# Patient Record
Sex: Female | Born: 1950 | Race: White | Hispanic: No | Smoking: Current every day smoker
Health system: Southern US, Community
[De-identification: ages and names within clinical notes are randomized; demographics above are authoritative.]

## PROBLEM LIST (undated history)

## (undated) DIAGNOSIS — T7840XA Allergy, unspecified, initial encounter: Secondary | ICD-10-CM

## (undated) DIAGNOSIS — F0280 Dementia in other diseases classified elsewhere without behavioral disturbance: Secondary | ICD-10-CM

## (undated) DIAGNOSIS — M1990 Unspecified osteoarthritis, unspecified site: Secondary | ICD-10-CM

## (undated) DIAGNOSIS — G9600 Cerebrospinal fluid leak, unspecified: Secondary | ICD-10-CM

## (undated) DIAGNOSIS — F319 Bipolar disorder, unspecified: Secondary | ICD-10-CM

## (undated) DIAGNOSIS — F419 Anxiety disorder, unspecified: Secondary | ICD-10-CM

## (undated) DIAGNOSIS — I10 Essential (primary) hypertension: Secondary | ICD-10-CM

## (undated) DIAGNOSIS — G709 Myoneural disorder, unspecified: Secondary | ICD-10-CM

## (undated) DIAGNOSIS — M810 Age-related osteoporosis without current pathological fracture: Secondary | ICD-10-CM

## (undated) DIAGNOSIS — S069X9A Unspecified intracranial injury with loss of consciousness of unspecified duration, initial encounter: Secondary | ICD-10-CM

## (undated) DIAGNOSIS — G459 Transient cerebral ischemic attack, unspecified: Secondary | ICD-10-CM

## (undated) DIAGNOSIS — E531 Pyridoxine deficiency: Secondary | ICD-10-CM

## (undated) DIAGNOSIS — E079 Disorder of thyroid, unspecified: Secondary | ICD-10-CM

## (undated) DIAGNOSIS — E785 Hyperlipidemia, unspecified: Secondary | ICD-10-CM

## (undated) DIAGNOSIS — F32A Depression, unspecified: Secondary | ICD-10-CM

## (undated) DIAGNOSIS — H269 Unspecified cataract: Secondary | ICD-10-CM

## (undated) DIAGNOSIS — C801 Malignant (primary) neoplasm, unspecified: Secondary | ICD-10-CM

## (undated) DIAGNOSIS — S069XAA Unspecified intracranial injury with loss of consciousness status unknown, initial encounter: Secondary | ICD-10-CM

## (undated) DIAGNOSIS — G4730 Sleep apnea, unspecified: Secondary | ICD-10-CM

## (undated) DIAGNOSIS — E559 Vitamin D deficiency, unspecified: Secondary | ICD-10-CM

## (undated) HISTORY — PX: ROTATOR CUFF REPAIR: SHX139

## (undated) HISTORY — PX: RECTOVAGINAL FISTULA CLOSURE: SUR265

## (undated) HISTORY — DX: Depression, unspecified: F32.A

## (undated) HISTORY — DX: Sleep apnea, unspecified: G47.30

## (undated) HISTORY — PX: COLONOSCOPY: SHX174

## (undated) HISTORY — DX: Hyperlipidemia, unspecified: E78.5

## (undated) HISTORY — DX: Myoneural disorder, unspecified: G70.9

## (undated) HISTORY — DX: Essential (primary) hypertension: I10

## (undated) HISTORY — DX: Age-related osteoporosis without current pathological fracture: M81.0

## (undated) HISTORY — PX: ABDOMINAL HYSTERECTOMY: SHX81

## (undated) HISTORY — DX: Malignant (primary) neoplasm, unspecified: C80.1

## (undated) HISTORY — DX: Disorder of thyroid, unspecified: E07.9

## (undated) HISTORY — DX: Unspecified cataract: H26.9

## (undated) HISTORY — DX: Allergy, unspecified, initial encounter: T78.40XA

## (undated) HISTORY — DX: Anxiety disorder, unspecified: F41.9

## (undated) HISTORY — DX: Unspecified osteoarthritis, unspecified site: M19.90

## (undated) HISTORY — PX: OTHER SURGICAL HISTORY: SHX169

## (undated) HISTORY — DX: Dementia in other diseases classified elsewhere, unspecified severity, without behavioral disturbance, psychotic disturbance, mood disturbance, and anxiety: F02.80

---

## 2002-12-10 HISTORY — PX: THYROIDECTOMY: SHX17

## 2020-12-14 ENCOUNTER — Ambulatory Visit: Admit: 2020-12-14 | Discharge status: Absent without leave | Payer: Self-pay

## 2020-12-14 ENCOUNTER — Other Ambulatory Visit: Payer: Self-pay

## 2020-12-14 ENCOUNTER — Ambulatory Visit
Admission: EM | Admit: 2020-12-14 | Discharge: 2020-12-14 | Disposition: A | Discharge status: Home / Self Care | Payer: Medicare Other | Attending: Family Medicine | Admitting: Family Medicine

## 2020-12-14 ENCOUNTER — Ambulatory Visit (INDEPENDENT_AMBULATORY_CARE_PROVIDER_SITE_OTHER): Payer: Medicare Other

## 2020-12-14 ENCOUNTER — Encounter: Payer: Self-pay | Admitting: Family Medicine

## 2020-12-14 DIAGNOSIS — R059 Cough, unspecified: Secondary | ICD-10-CM

## 2020-12-14 DIAGNOSIS — J22 Unspecified acute lower respiratory infection: Secondary | ICD-10-CM

## 2020-12-14 DIAGNOSIS — Z1152 Encounter for screening for COVID-19: Secondary | ICD-10-CM

## 2020-12-14 MED ORDER — ALBUTEROL SULFATE HFA 108 (90 BASE) MCG/ACT IN AERS: 1.0000 | INHALATION_SPRAY | Freq: Four times a day (QID) | RESPIRATORY_TRACT | 0 refills | Status: DC | PRN

## 2020-12-14 MED ORDER — PREDNISONE 10 MG PO TABS: 40.0000 mg | ORAL_TABLET | Freq: Every day | ORAL | 0 refills | Status: AC

## 2020-12-14 MED ORDER — AZITHROMYCIN 250 MG PO TABS: 250.0000 mg | ORAL_TABLET | Freq: Every day | ORAL | 0 refills | Status: DC

## 2020-12-14 MED ORDER — BENZONATATE 100 MG PO CAPS: 200.0000 mg | ORAL_CAPSULE | Freq: Three times a day (TID) | ORAL | 0 refills | Status: DC

## 2020-12-14 MED ORDER — AMOXICILLIN-POT CLAVULANATE 875-125 MG PO TABS: 1.0000 | ORAL_TABLET | Freq: Two times a day (BID) | ORAL | 0 refills | Status: DC

## 2020-12-14 MED ORDER — PREDNISONE 10 MG PO TABS: 40.0000 mg | ORAL_TABLET | Freq: Every day | ORAL | 0 refills | Status: DC

## 2020-12-14 NOTE — Discharge Instructions (Addendum)
Treating you for a respiratory infection and inflammation in the lungs. Antibiotics as prescribed.  Prednisone daily for 5 days for lung inflammation.  Albuterol as needed for cough, wheezing or shortness of breath.  Tessalon Perles for cough as needed. Please follow-up in 6 weeks for repeat x-ray of the lungs

## 2020-12-15 NOTE — ED Provider Notes (Signed)
Shannon Hart    CSN: [id number] Arrival date & time: 12/14/20  1352      History   Chief Complaint Chief Complaint  Patient presents with  . Cough  . Sore Throat    HPI Shannon Hart is a 70 y.o. female.   Pt is a 70 year old female that presents with cough, congestion, sore throat, low-grade fevers.  Symptoms been constant worsening over the past week.  Coughing up mucus.  Mild shortness of breath.  History of COPD     History reviewed. No pertinent past medical history.  There are no problems to display for this patient.   History reviewed. No pertinent surgical history.  OB History   No obstetric history on file.      Home Medications    Prior to Admission medications   Medication Sig Start Date End Date Taking? Authorizing Provider  atorvastatin (LIPITOR) 40 MG tablet Take by mouth. 09/05/20  Yes [provider]  donepezil (ARICEPT) 10 MG tablet Take by mouth. 08/04/20 03/08/21 Yes [provider]  ergocalciferol (VITAMIN D2) 1.25 MG (50000 UT) capsule Take by mouth. 06/10/20 06/10/21 Yes [provider]  escitalopram (LEXAPRO) 20 MG tablet  10/10/19  Yes [provider]  ferrous sulfate 325 (65 FE) MG tablet Take by mouth. 06/10/20  Yes [provider]  fluticasone (FLONASE) 50 MCG/ACT nasal spray 2 sprays each nostril BID. DISP#  2 bottles = 1 month supply. 12/09/20  Yes [provider]  gabapentin (NEURONTIN) 300 MG capsule Take one capsule (300 mg) at 8am and one capsule (300 mg) at 2pm daily (600 mg total) and 2 capsule every night 06/10/20  Yes [provider]  HYDROcodone-acetaminophen (NORCO/VICODIN) 5-325 MG tablet Take by mouth. 11/24/19  Yes [provider]  levothyroxine (SYNTHROID) 88 MCG tablet Take by mouth. 12/09/20 12/09/21 Yes [provider]  lisinopril (ZESTRIL) 20 MG tablet Take by mouth. 06/10/20  Yes [provider]  venlafaxine XR (EFFEXOR-XR) 150 MG 24 hr  capsule Take by mouth. 12/08/20 03/08/21 Yes [provider]  albuterol (VENTOLIN HFA) 108 (90 Base) MCG/ACT inhaler Inhale 1-2 puffs into the lungs every 6 (six) hours as needed for wheezing or shortness of breath. 12/14/20   Loura Halt A, NP  amoxicillin-clavulanate (AUGMENTIN) 875-125 MG tablet Take 1 tablet by mouth every 12 (twelve) hours. 12/14/20   Loura Halt A, NP  aspirin 81 MG EC tablet Take by mouth.    [provider]  azithromycin (ZITHROMAX) 250 MG tablet Take 1 tablet (250 mg total) by mouth daily. Take first 2 tablets together, then 1 every day until finished. 12/14/20   Loura Halt A, NP  benzonatate (TESSALON) 100 MG capsule Take 2 capsules (200 mg total) by mouth every 8 (eight) hours. 12/14/20   Orvan July, NP  butalbital-aspirin-caffeine-codeine (FIORINAL WITH CODEINE) 50-325-40-30 MG capsule Take by mouth.    [provider]  donepezil (ARICEPT) 10 MG tablet Take 10 mg by mouth at bedtime. 11/29/20   [provider]  predniSONE (DELTASONE) 10 MG tablet Take 4 tablets (40 mg total) by mouth daily for 5 days. 12/14/20 12/19/20  Loura Halt A, NP  trospium (SANCTURA) 20 MG tablet Take 20 mg by mouth 2 (two) times daily. 08/27/20   [provider]    Family History History reviewed. No pertinent family history.  Social History     Allergies   Patient has no known allergies.   Review of Systems Review of  Systems   Physical Exam Triage Vital Signs ED Triage Vitals  Enc Vitals Group     BP 12/14/20 1444 130/77     Pulse Rate 12/14/20 1444 83     Resp 12/14/20 1444 (!) 22     Temp 12/14/20 1444 99.3 F (37.4 C)     Temp src --      SpO2 12/14/20 1444 (!) 89 %     Weight --      Height --      Head Circumference --      Peak Flow --      Pain Score 12/14/20 1445 0     Pain Loc --      Pain Edu? --      Excl. in [place]? --    No data found.  Updated Vital Signs BP 130/77   Pulse 83   Temp 99.3 F (37.4 C)   Resp (!) 22    SpO2 (!) 89%   Visual Acuity Right Eye Distance:   Left Eye Distance:   Bilateral Distance:    Right Eye Near:   Left Eye Near:    Bilateral Near:     Physical Exam Vitals and nursing note reviewed.  Constitutional:      General: She is not in acute distress.    Appearance: Normal appearance. She is not ill-appearing, toxic-appearing or diaphoretic.  HENT:     Head: Normocephalic.     Nose: Nose normal.  Eyes:     Conjunctiva/sclera: Conjunctivae normal.  Cardiovascular:     Rate and Rhythm: Normal rate and regular rhythm.  Pulmonary:     Effort: Pulmonary effort is normal.     Breath sounds: Wheezing and rhonchi present.  Musculoskeletal:        General: Normal range of motion.     Cervical back: Normal range of motion.  Skin:    General: Skin is warm and dry.     Findings: No rash.  Neurological:     Mental Status: She is alert.  Psychiatric:        Mood and Affect: Mood normal.      UC Treatments / Results  Labs (all labs ordered are listed, but only abnormal results are displayed) Labs Reviewed  NOVEL CORONAVIRUS, NAA    EKG   Radiology DG Chest 2 View  Result Date: 12/14/2020 CLINICAL DATA:  Shortness of breath, productive cough EXAM: CHEST - 2 VIEW COMPARISON:  None. FINDINGS: 14 mm nodular opacity projecting over the right lower chest likely reflecting a nipple shadow. No focal consolidation. No pleural effusion or pneumothorax. Heart and mediastinal contours are unremarkable. No acute osseous abnormality. IMPRESSION: 1. No active cardiopulmonary disease. 2. A 14 mm nodular opacity projecting over the right lower chest likely reflecting a nipple shadow. Recommend repeat x-ray with nipple markers. Electronically Signed   By: Elige Ko   On: 12/14/2020 15:56    Procedures Procedures (including critical care time)  Medications Ordered in UC Medications - No data to display  Initial Impression / Assessment and Plan / UC Course  I have reviewed  the triage vital signs and the nursing notes.  Pertinent labs & imaging results that were available during my care of the patient were reviewed by me and considered in my medical decision making (see chart for details).     Lower respiratory infection X-ray with 14 mm nodular opacity projecting over the right lower chest.  Concern for pneumonia versus underlying carcinoma.  Rhonchi and  wheezing on exam.  Patient mildly ill-appearing with low-grade fever today.  Oxygen saturations 89%. Hx of COPD.  Treating with dual therapy based on comorbidities, smoking history and age.  Azithromycin and amoxicillin to treat. Albuterol as needed for cough, wheezing or shortness of breath. Prednisone burst for lung inflammation Tessalon Perles for cough Recommended repeat x-ray in 6 weeks for recheck Follow up as needed for continued or worsening symptoms  Final Clinical Impressions(s) / UC Diagnoses   Final diagnoses:  Lower respiratory infection     Discharge Instructions     Treating you for a respiratory infection and inflammation in the lungs. Antibiotics as prescribed.  Prednisone daily for 5 days for lung inflammation.  Albuterol as needed for cough, wheezing or shortness of breath.  Tessalon Perles for cough as needed. Please follow-up in 6 weeks for repeat x-ray of the lungs    ED Prescriptions    Medication Sig Dispense Auth. Provider   amoxicillin-clavulanate (AUGMENTIN) 875-125 MG tablet  (Status: Discontinued) Take 1 tablet by mouth every 12 (twelve) hours. 14 tablet Saliyah Gillin A, NP   azithromycin (ZITHROMAX) 250 MG tablet  (Status: Discontinued) Take 1 tablet (250 mg total) by mouth daily. Take first 2 tablets together, then 1 every day until finished. 6 tablet Daesia Zylka A, NP   predniSONE (DELTASONE) 10 MG tablet  (Status: Discontinued) Take 4 tablets (40 mg total) by mouth daily for 5 days. 20 tablet Galen Russman A, NP   albuterol (VENTOLIN HFA) 108 (90 Base) MCG/ACT inhaler   (Status: Discontinued) Inhale 1-2 puffs into the lungs every 6 (six) hours as needed for wheezing or shortness of breath. 1 each Ruthann Angulo A, NP   benzonatate (TESSALON) 100 MG capsule  (Status: Discontinued) Take 2 capsules (200 mg total) by mouth every 8 (eight) hours. 45 capsule Cierrah Dace A, NP   albuterol (VENTOLIN HFA) 108 (90 Base) MCG/ACT inhaler Inhale 1-2 puffs into the lungs every 6 (six) hours as needed for wheezing or shortness of breath. 1 each Loura Halt A, NP   amoxicillin-clavulanate (AUGMENTIN) 875-125 MG tablet Take 1 tablet by mouth every 12 (twelve) hours. 14 tablet Karas Pickerill A, NP   azithromycin (ZITHROMAX) 250 MG tablet Take 1 tablet (250 mg total) by mouth daily. Take first 2 tablets together, then 1 every day until finished. 6 tablet Samuel Rittenhouse A, NP   benzonatate (TESSALON) 100 MG capsule Take 2 capsules (200 mg total) by mouth every 8 (eight) hours. 45 capsule Danaka Llera A, NP   predniSONE (DELTASONE) 10 MG tablet Take 4 tablets (40 mg total) by mouth daily for 5 days. 20 tablet Loura Halt A, NP     PDMP not reviewed this encounter.   Orvan July, NP 12/15/20 [phone]

## 2020-12-16 LAB — SARS-COV-2, NAA 2 DAY TAT

## 2020-12-16 LAB — NOVEL CORONAVIRUS, NAA: SARS-CoV-2, NAA: NOT DETECTED

## 2021-06-15 ENCOUNTER — Emergency Department
Admission: EM | Admit: 2021-06-15 | Discharge: 2021-06-15 | Disposition: A | Discharge status: Home / Self Care | Payer: Medicare Other | Attending: Student in an Organized Health Care Education/Training Program | Admitting: Student in an Organized Health Care Education/Training Program

## 2021-06-15 ENCOUNTER — Other Ambulatory Visit: Payer: Self-pay

## 2021-06-15 ENCOUNTER — Emergency Department: Payer: Medicare Other

## 2021-06-15 ENCOUNTER — Encounter: Payer: Self-pay | Admitting: Emergency Medicine

## 2021-06-15 DIAGNOSIS — D734 Cyst of spleen: Secondary | ICD-10-CM | POA: Diagnosis not present

## 2021-06-15 DIAGNOSIS — F17210 Nicotine dependence, cigarettes, uncomplicated: Secondary | ICD-10-CM | POA: Insufficient documentation

## 2021-06-15 DIAGNOSIS — Z91040 Latex allergy status: Secondary | ICD-10-CM | POA: Insufficient documentation

## 2021-06-15 DIAGNOSIS — Z7982 Long term (current) use of aspirin: Secondary | ICD-10-CM | POA: Insufficient documentation

## 2021-06-15 DIAGNOSIS — R159 Full incontinence of feces: Secondary | ICD-10-CM | POA: Diagnosis not present

## 2021-06-15 DIAGNOSIS — R197 Diarrhea, unspecified: Secondary | ICD-10-CM | POA: Diagnosis present

## 2021-06-15 HISTORY — DX: Transient cerebral ischemic attack, unspecified: G45.9

## 2021-06-15 HISTORY — DX: Vitamin D deficiency, unspecified: E55.9

## 2021-06-15 HISTORY — DX: Pyridoxine deficiency: E53.1

## 2021-06-15 HISTORY — DX: Dementia in other diseases classified elsewhere, unspecified severity, without behavioral disturbance, psychotic disturbance, mood disturbance, and anxiety: F02.80

## 2021-06-15 HISTORY — DX: Cerebrospinal fluid leak, unspecified: G96.00

## 2021-06-15 HISTORY — DX: Unspecified intracranial injury with loss of consciousness of unspecified duration, initial encounter: S06.9X9A

## 2021-06-15 HISTORY — DX: Bipolar disorder, unspecified: F31.9

## 2021-06-15 HISTORY — DX: Unspecified intracranial injury with loss of consciousness status unknown, initial encounter: S06.9XAA

## 2021-06-15 LAB — COMPREHENSIVE METABOLIC PANEL
ALT: 20 U/L (ref 0–44)
AST: 22 U/L (ref 15–41)
Albumin: 4.1 g/dL (ref 3.5–5.0)
Alkaline Phosphatase: 127 U/L — ABNORMAL HIGH (ref 38–126)
Anion gap: 8 (ref 5–15)
BUN: 11 mg/dL (ref 8–23)
CO2: 28 mmol/L (ref 22–32)
Calcium: 8.8 mg/dL — ABNORMAL LOW (ref 8.9–10.3)
Chloride: 105 mmol/L (ref 98–111)
Creatinine, Ser: 0.65 mg/dL (ref 0.44–1.00)
GFR, Estimated: >60 mL/min (ref >60)
Glucose, Bld: 141 mg/dL — ABNORMAL HIGH (ref 70–99)
Potassium: 3.3 mmol/L — ABNORMAL LOW (ref 3.5–5.1)
Sodium: 141 mmol/L (ref 135–145)
Total Bilirubin: 0.6 mg/dL (ref 0.3–1.2)
Total Protein: 6.8 g/dL (ref 6.5–8.1)

## 2021-06-15 LAB — CBC
HCT: 37.6 % (ref 36.0–46.0)
Hemoglobin: 12.1 g/dL (ref 12.0–15.0)
MCH: 30.6 pg (ref 26.0–34.0)
MCHC: 32.2 g/dL (ref 30.0–36.0)
MCV: 95.2 fL (ref 80.0–100.0)
Platelets: 190 10*3/uL (ref 150–400)
RBC: 3.95 MIL/uL (ref 3.87–5.11)
RDW: 13 % (ref 11.5–15.5)
WBC: 6.6 10*3/uL (ref 4.0–10.5)
nRBC: 0 % (ref 0.0–0.2)

## 2021-06-15 LAB — URINALYSIS, COMPLETE (UACMP) WITH MICROSCOPIC
Bilirubin Urine: NEGATIVE
Glucose, UA: NEGATIVE mg/dL
Hgb urine dipstick: NEGATIVE
Ketones, ur: NEGATIVE mg/dL
Nitrite: NEGATIVE
Protein, ur: NEGATIVE mg/dL
Specific Gravity, Urine: 1.011 (ref 1.005–1.030)
pH: 5 (ref 5.0–8.0)

## 2021-06-15 LAB — WET PREP, GENITAL
Clue Cells Wet Prep HPF POC: NONE SEEN
Sperm: NONE SEEN
Trich, Wet Prep: NONE SEEN
Yeast Wet Prep HPF POC: NONE SEEN

## 2021-06-15 LAB — LIPASE, BLOOD: Lipase: 54 U/L — ABNORMAL HIGH (ref 11–51)

## 2021-06-15 NOTE — ED Provider Notes (Signed)
[facility] Emergency Department Provider Note  ____________________________________________   Event Date/Time   First MD Initiated Contact with Patient 06/15/21 1318     (approximate)  I have reviewed the triage vital signs and the nursing notes.   HISTORY  Chief Complaint Diarrhea  HPI Shannon Hart is a 70 y.o. female with history below including Alzheimer's, bipolar, TBI, presents to the ED concern for possible rectal vesicular fistula.  Patient would describe some staining of stool in her undergarment overnight, but denies any frank diarrhea.  She has had reconstructive surgery of the vaginal walls in the past, concern for possible recurrence.  She denies any fever, chills, sweats, chest pain, shortness of breath. Past Medical History:  Diagnosis Date   Alzheimer disease ([place])    Bipolar 1 disorder ([place])    CSF leak    TBI (traumatic brain injury) ([place])    TIA (transient ischemic attack)    Vitamin B6 deficiency    Vitamin D deficiency     There are no problems to display for this patient.   Past Surgical History:  Procedure Laterality Date   ABDOMINAL HYSTERECTOMY     RECTOVAGINAL FISTULA CLOSURE     ROTATOR CUFF REPAIR      Prior to Admission medications   Medication Sig Start Date End Date Taking? Authorizing Provider  albuterol (VENTOLIN HFA) 108 (90 Base) MCG/ACT inhaler Inhale 1-2 puffs into the lungs every 6 (six) hours as needed for wheezing or shortness of breath. 12/14/20   Loura Halt A, NP  amoxicillin-clavulanate (AUGMENTIN) 875-125 MG tablet Take 1 tablet by mouth every 12 (twelve) hours. 12/14/20   Loura Halt A, NP  aspirin 81 MG EC tablet Take by mouth.    [provider]  atorvastatin (LIPITOR) 40 MG tablet Take by mouth. 09/05/20   [provider]  azithromycin (ZITHROMAX) 250 MG tablet Take 1 tablet (250 mg total) by mouth daily. Take first 2 tablets together, then 1 every day until finished. 12/14/20   Loura Halt A, NP  benzonatate (TESSALON) 100 MG capsule Take 2 capsules (200 mg total) by mouth every 8 (eight) hours. 12/14/20   Orvan July, NP  butalbital-aspirin-caffeine-codeine (FIORINAL WITH CODEINE) 50-325-40-30 MG capsule Take by mouth.    [provider]  donepezil (ARICEPT) 10 MG tablet Take by mouth. 08/04/20 03/08/21  [provider]  donepezil (ARICEPT) 10 MG tablet Take 10 mg by mouth at bedtime. 11/29/20   [provider]  escitalopram (LEXAPRO) 20 MG tablet  10/10/19   [provider]  ferrous sulfate 325 (65 FE) MG tablet Take by mouth. 06/10/20   [provider]  fluticasone (FLONASE) 50 MCG/ACT nasal spray 2 sprays each nostril BID. DISP#  2 bottles = 1 month supply. 12/09/20   [provider]  gabapentin (NEURONTIN) 300 MG capsule Take one capsule (300 mg) at 8am and one capsule (300 mg) at 2pm daily (600 mg total) and 2 capsule every night 06/10/20   [provider]  HYDROcodone-acetaminophen (NORCO/VICODIN) 5-325 MG tablet Take by mouth. 11/24/19   [provider]  levothyroxine (SYNTHROID) 88 MCG tablet Take by mouth. 12/09/20 12/09/21  [provider]  lisinopril (ZESTRIL) 20 MG tablet Take by mouth. 06/10/20   [provider]  trospium (SANCTURA) 20 MG tablet Take 20 mg by mouth 2 (two) times daily. 08/27/20   [provider]  venlafaxine XR (EFFEXOR-XR) 150 MG 24 hr capsule Take by mouth. 12/08/20 03/08/21  [provider]    Allergies Latex  History reviewed. No pertinent family history.  Social History Social History   Tobacco Use   Smoking status: Every Day    Pack years: 0.00    Types: Cigarettes   Smokeless tobacco: Never  Substance Use Topics   Alcohol use: Not Currently   Drug use: Not Currently    Review of Systems  Constitutional: No fever/chills Eyes: No visual changes. ENT: No sore throat. Cardiovascular: Denies chest pain. Respiratory: Denies  shortness of breath. Gastrointestinal: No abdominal pain.  No nausea, no vomiting.  No diarrhea.  No constipation.  Stool leakage as noted above. Genitourinary: Negative for dysuria. Musculoskeletal: Negative for back pain. Skin: Negative for rash. Neurological: Negative for headaches, focal weakness or numbness. ____________________________________________   PHYSICAL EXAM:  VITAL SIGNS: ED Triage Vitals  Enc Vitals Group     BP 06/15/21 1345 (!) 157/84     Pulse Rate 06/15/21 1345 64     Resp 06/15/21 1345 18     Temp 06/15/21 1649 98.7 F (37.1 C)     Temp Source 06/15/21 1649 Oral     SpO2 06/15/21 1345 99 %     Weight 06/15/21 1228 162 lb (73.5 kg)     Height 06/15/21 1228 5\' 3"  (1.6 m)     Head Circumference --      Peak Flow --      Pain Score 06/15/21 1228 0     Pain Loc --      Pain Edu? --      Excl. in [place]? --     Constitutional: Alert and oriented. Well appearing and in no acute distress. Eyes: Conjunctivae are normal. PERRL. EOMI. Head: Atraumatic. Nose: No congestion/rhinnorhea. Mouth/Throat: Mucous membranes are moist.  Oropharynx non-erythematous. Neck: No stridor.   Cardiovascular: Normal rate, regular rhythm. Grossly normal heart sounds.  Good peripheral circulation. Respiratory: Normal respiratory effort.  No retractions. Lungs CTAB. Gastrointestinal: Soft and nontender. No distention. No abdominal bruits. No CVA tenderness.  Normal rectal tone.  Brown stool in the vault.  Heme negative stool sample. Genitourinary: Normal external genitalia.  Cervix is absent.  No feculent material seen in the vault.  No sinus tracts or skin tags noted around the perineum. Musculoskeletal: No lower extremity tenderness nor edema.  No joint effusions. Neurologic:  Normal speech and language. No gross focal neurologic deficits are appreciated. No gait instability. Skin:  Skin is warm, dry and intact. No rash noted. Psychiatric: Mood and affect are normal. Speech and behavior  are normal.  ____________________________________________   LABS (all labs ordered are listed, but only abnormal results are displayed)  Labs Reviewed  WET PREP, GENITAL - Abnormal; Notable for the following components:      Result Value   WBC, Wet Prep HPF POC FEW (*)    All other components within normal limits  LIPASE, BLOOD - Abnormal; Notable for the following components:   Lipase 54 (*)    All other components within normal limits  COMPREHENSIVE METABOLIC PANEL - Abnormal; Notable for the following components:   Potassium 3.3 (*)    Glucose, Bld 141 (*)    Calcium 8.8 (*)    Alkaline Phosphatase 127 (*)    All other components within normal limits  URINALYSIS, COMPLETE (UACMP) WITH MICROSCOPIC - Abnormal; Notable for the following components:   Color, Urine YELLOW (*)    APPearance HAZY (*)    Leukocytes,Ua TRACE (*)    Bacteria, UA RARE (*)  All other components within normal limits  CBC   ____________________________________________  EKG   ____________________________________________  RADIOLOGY I, Melvenia Needles, personally viewed and evaluated these images (plain radiographs) as part of my medical decision making, as well as reviewing the written report by the radiologist.  ED MD interpretation:  agree with report  Official radiology report(s): CT ABDOMEN PELVIS WO CONTRAST  Result Date: 06/15/2021 CLINICAL DATA:  Diarrhea. 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 they are unsure if the stool is coming from her rectum or her vagina. PT has had reconstruction of the vaginal wall to the rectum in the past. EXAM: CT ABDOMEN AND PELVIS WITHOUT CONTRAST TECHNIQUE: Multidetector CT imaging of the abdomen and pelvis was performed following the standard protocol without IV contrast. COMPARISON:  None. FINDINGS: Lower chest: No acute abnormality. Hepatobiliary: No focal liver abnormality. No gallstones, gallbladder wall thickening, or pericholecystic fluid. No biliary  dilatation. Pancreas: No focal lesion. Normal pancreatic contour. No surrounding inflammatory changes. No main pancreatic ductal dilatation. Spleen: Normal in size. Nonspecific 2.3 cm fluid density lesion within the splenic parenchyma. Adrenals/Urinary Tract: No adrenal nodule bilaterally. Couple punctate calcifications associated with the right kidney may be vascular. No nephrolithiasis, no hydronephrosis, and no contour-deforming renal mass. No ureterolithiasis or hydroureter. The urinary bladder is unremarkable. Stomach/Bowel: Stomach is within normal limits. No evidence of bowel wall thickening or dilatation. Appendix appears normal. Vascular/Lymphatic: No abdominal aorta or iliac aneurysm. Moderate atherosclerotic plaque of the aorta and its branches. No abdominal, pelvic, or inguinal lymphadenopathy. Reproductive: Status post hysterectomy. No adnexal masses. Other: Normal fat plane identified between the vagina and rectum. Grossly normal fat plane identified surrounding the urinary bladder. No intraperitoneal free fluid. No intraperitoneal free gas. No organized fluid collection. Musculoskeletal: No abdominal wall hernia or abnormality.  Pelvic floor laxity. No suspicious lytic or blastic osseous lesions. No acute displaced fracture. Multilevel degenerative changes of the spine. Grade 1 anterolisthesis of L4 on L5. IMPRESSION: 1. No fistulization identified with grossly normal fat plane surrounding the rectum, urinary bladder, vagina. Please note limited evaluation on this noncontrast study. 2. Pelvic floor laxity. 3. Status post hysterectomy. 4. Nonspecific 2.3 cm splenic cystic lesion. Correlation with prior cross-sectional imaging would be of value. 5.  Aortic Atherosclerosis (ICD10-I70.0). Electronically Signed   By: Iven Finn M.D.   On: 06/15/2021 16:03    ____________________________________________   PROCEDURES  Procedure(s) performed (including Critical  Care):  Procedures   ____________________________________________   INITIAL IMPRESSION / ASSESSMENT AND PLAN / ED COURSE  As part of my medical decision making, I reviewed the following data within the [place] History obtained from family, Labs reviewed WNL, Radiograph reviewed as above, and Notes from prior ED visits     DDX: constipation, fecal impaction, encopresis, fistula   Female patient ED evaluation of concern for possible rectovesicular fistula after she has had some stool leakage over the last day or so.  Exam was reassuring at this time as it showed no fecal material in the vaginal vault.  CT imaging also did not reveal any obvious fistula development.  Patient likely is experiencing some mild ankle paresis without indication of bowel obstruction or fecal impaction.  She will follow-up with her GYN urology specialist for ongoing symptoms.  Return precautions are reviewed. ___________________________________________   FINAL CLINICAL IMPRESSION(S) / ED DIAGNOSES  Final diagnoses:  Encopresis     ED Discharge Orders     None        Note:  This document was prepared  using Systems analyst and may include unintentional dictation errors.    Melvenia Needles, PA-C 06/15/21 2022    Nena Polio, MD 06/17/21 2137

## 2021-06-15 NOTE — Discharge Instructions (Addendum)
Your exam, labs, and CT scan are all normal and reassuring at this time.  No signs of a fistula between the rectum and vagina.  You should follow-up with your primary provider or GYN/Urology specialist for ongoing symptoms.

## 2021-06-15 NOTE — ED Triage Notes (Signed)
Pt comes into the ED via POV c/o diarrhea at night.  Per the family, they are unsure if the stool is coming from her rectum or her vagina.  PT has had reconstruction of the vaginal wall to the rectum in the past.  PT states she is not having full BM's in the morning after they find some leakage in her brief.  PT in NAD at this time.

## 2021-12-10 HISTORY — PX: DG SHOULDER RIGHT COMPLETE: HXRAD1612

## 2021-12-12 IMAGING — DX DG CHEST 2V
2 series · 2 of 2 positions shown · non-contrast
Comparison: None.

CLINICAL DATA: Shortness of breath, productive cough

EXAM:
CHEST - 2 VIEW

[chest pa]
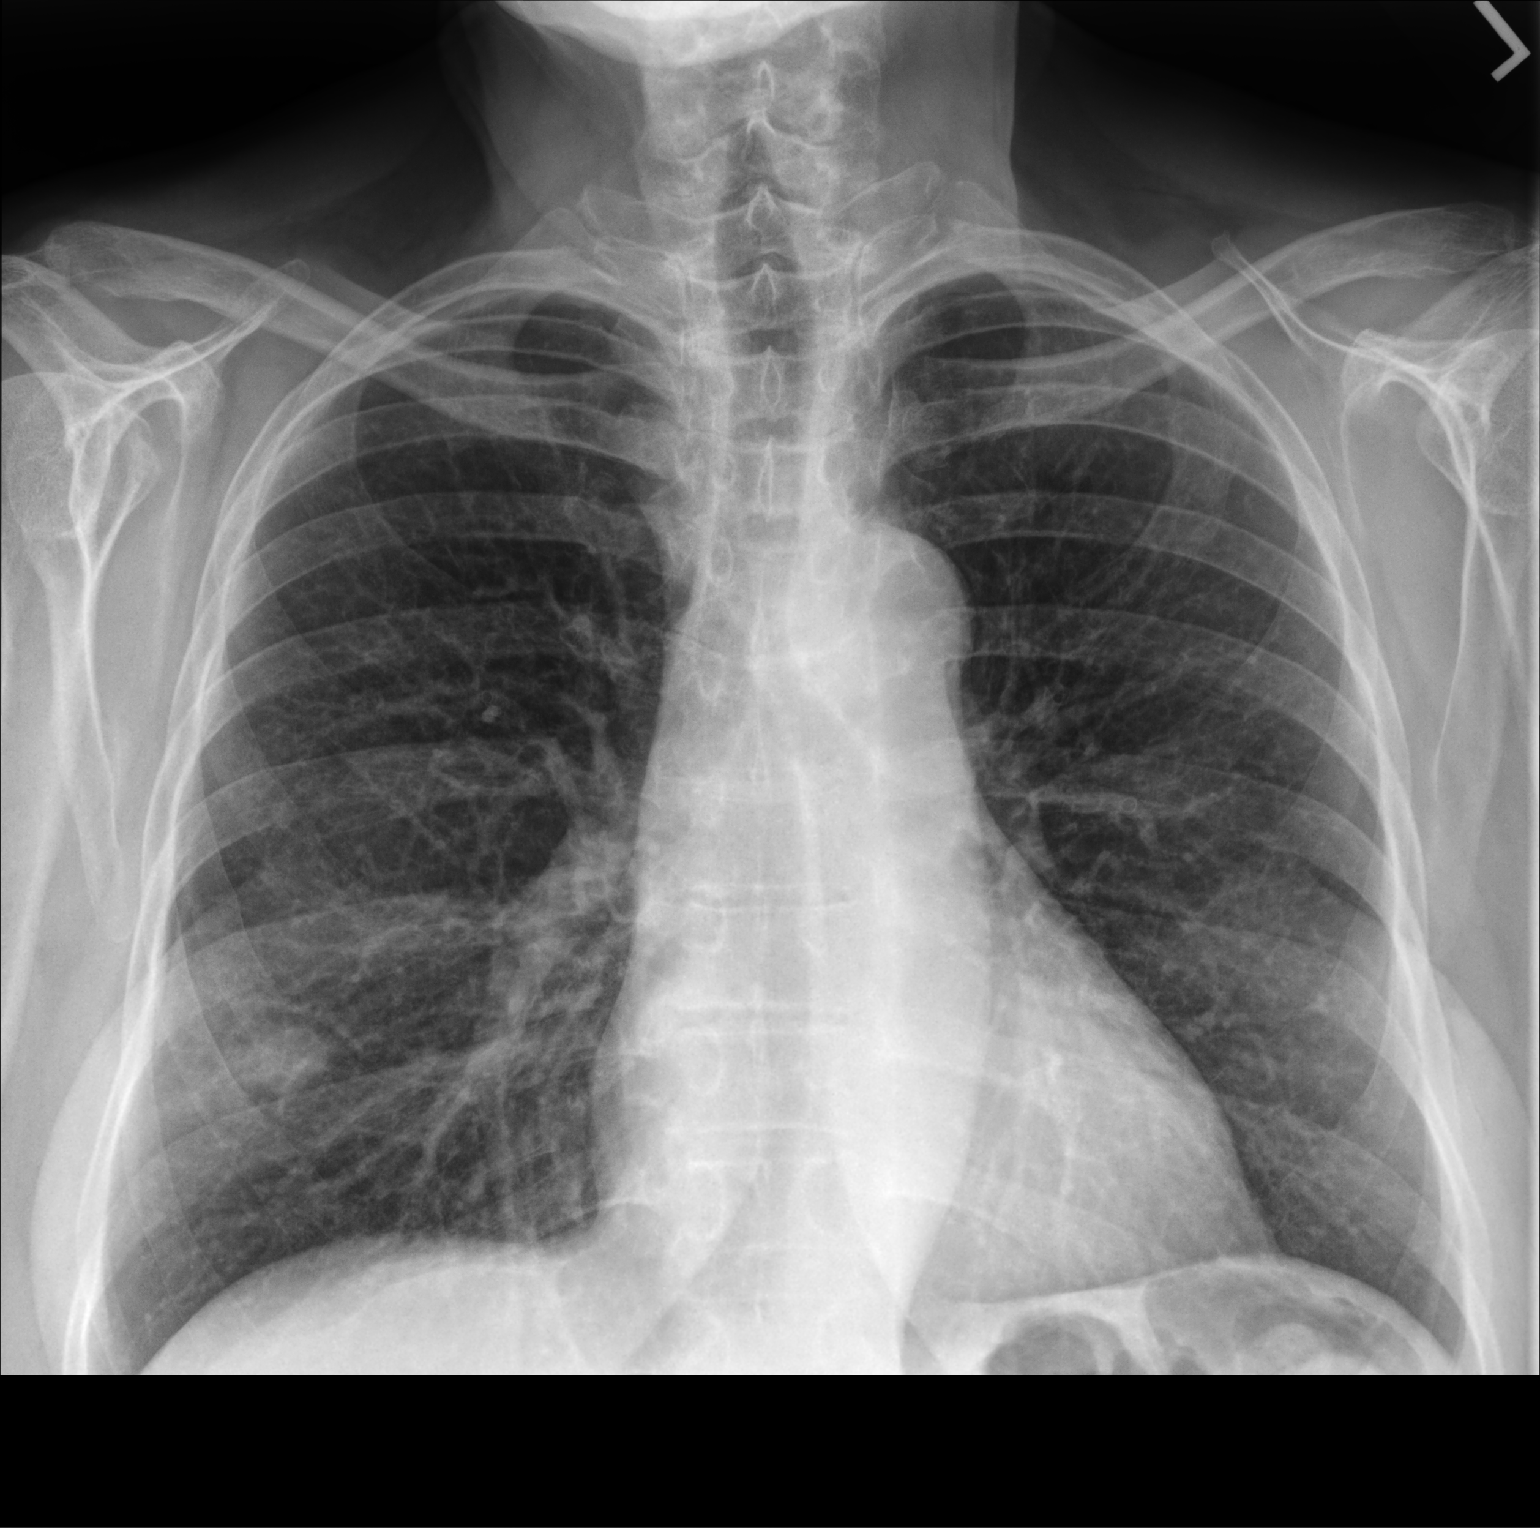

[chest lat]
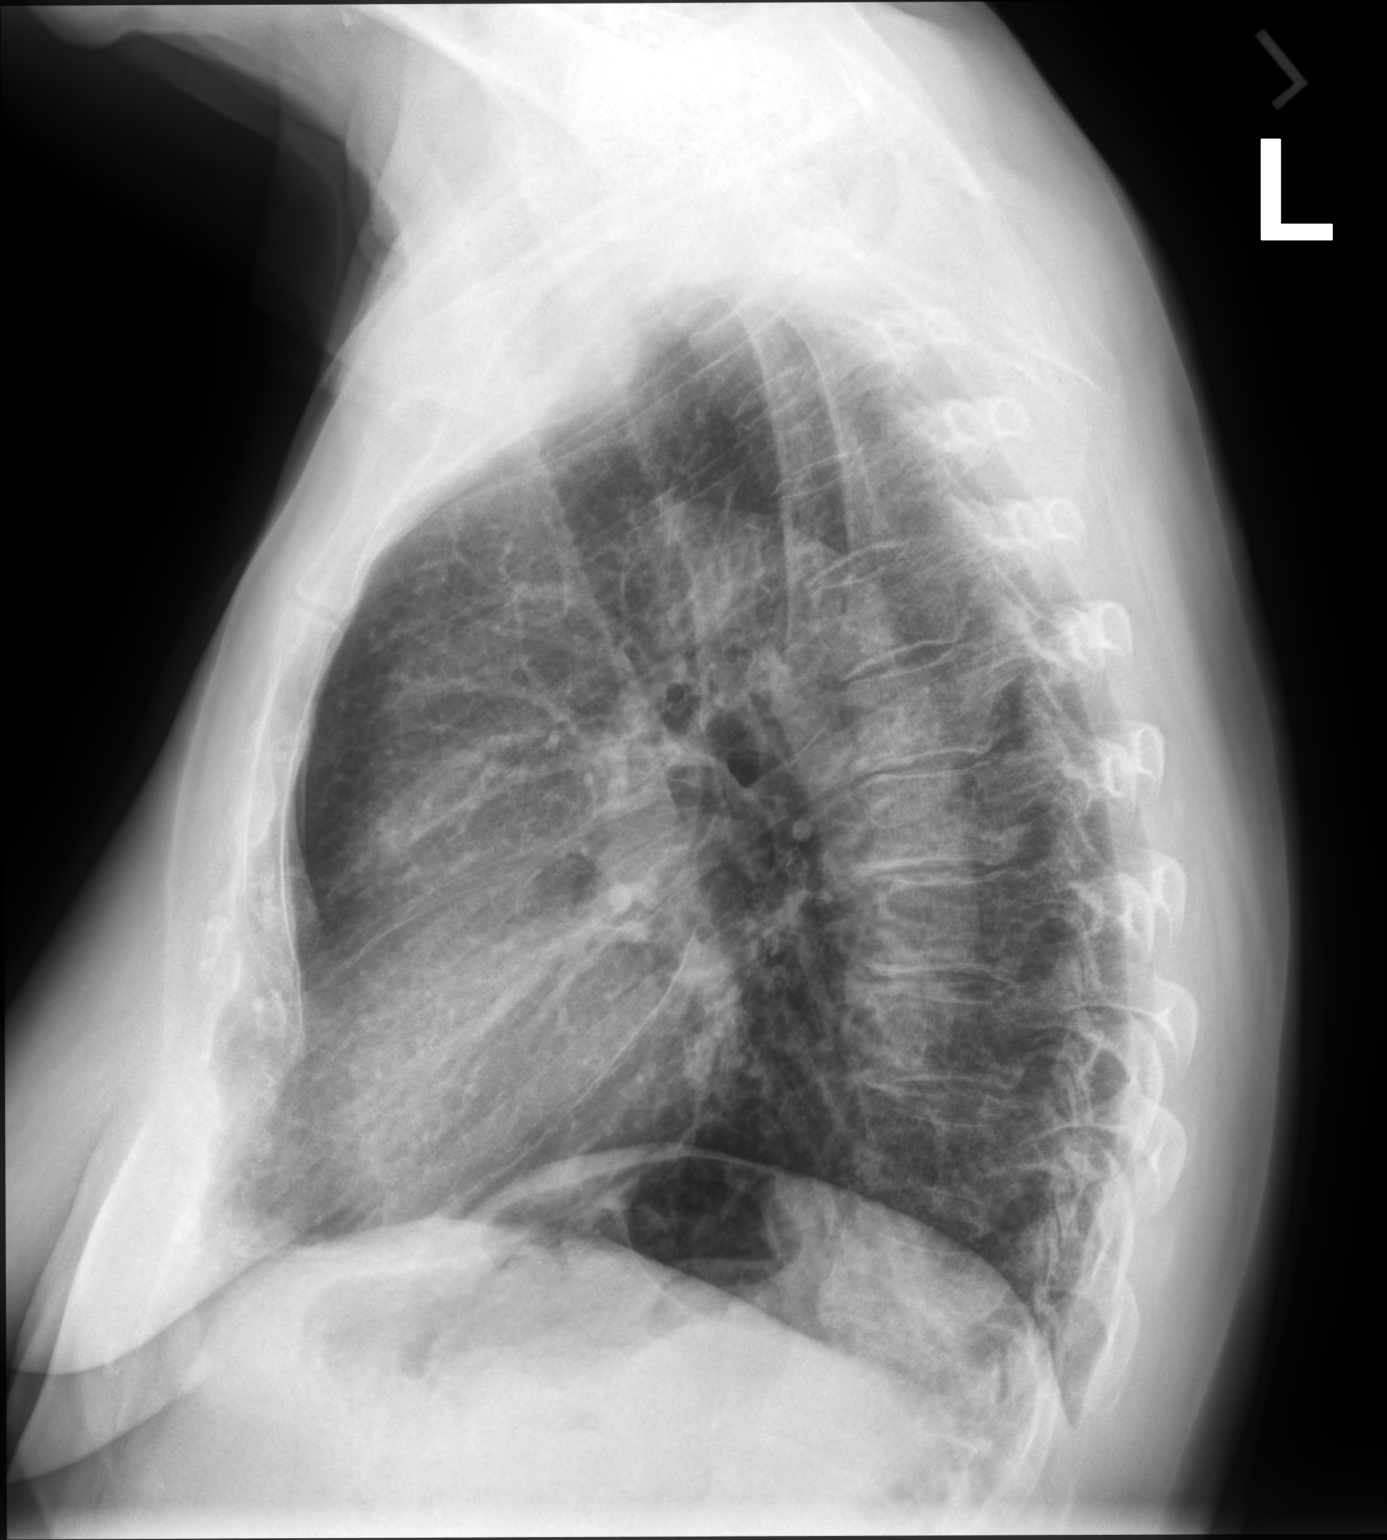

[2 of 2 positions shown; findings below may reference images not displayed]

FINDINGS: 14 mm nodular opacity projecting over the right lower chest likely
reflecting a nipple shadow. No focal consolidation. No pleural
effusion or pneumothorax. Heart and mediastinal contours are
unremarkable.

No acute osseous abnormality.
IMPRESSION: 1. No active cardiopulmonary disease.
2. A 14 mm nodular opacity projecting over the right lower chest
likely reflecting a nipple shadow. Recommend repeat x-ray with
nipple markers.

## 2022-11-09 ENCOUNTER — Encounter: Payer: Self-pay | Admitting: Adult Health

## 2022-11-09 ENCOUNTER — Ambulatory Visit (INDEPENDENT_AMBULATORY_CARE_PROVIDER_SITE_OTHER): Payer: Medicare Other | Admitting: Adult Health

## 2022-11-09 VITALS — BP 128/82 | HR 78 | Temp 97.3°F | Resp 18 | Ht 63.0 in | Wt 166.0 lb

## 2022-11-09 DIAGNOSIS — G629 Polyneuropathy, unspecified: Secondary | ICD-10-CM

## 2022-11-09 DIAGNOSIS — E890 Postprocedural hypothyroidism: Secondary | ICD-10-CM

## 2022-11-09 DIAGNOSIS — Z7689 Persons encountering health services in other specified circumstances: Secondary | ICD-10-CM

## 2022-11-09 DIAGNOSIS — Z1231 Encounter for screening mammogram for malignant neoplasm of breast: Secondary | ICD-10-CM

## 2022-11-09 DIAGNOSIS — R29818 Other symptoms and signs involving the nervous system: Secondary | ICD-10-CM | POA: Diagnosis not present

## 2022-11-09 DIAGNOSIS — Z13820 Encounter for screening for osteoporosis: Secondary | ICD-10-CM

## 2022-11-09 DIAGNOSIS — M797 Fibromyalgia: Secondary | ICD-10-CM

## 2022-11-09 DIAGNOSIS — Z85828 Personal history of other malignant neoplasm of skin: Secondary | ICD-10-CM

## 2022-11-09 DIAGNOSIS — F5101 Primary insomnia: Secondary | ICD-10-CM | POA: Diagnosis not present

## 2022-11-09 DIAGNOSIS — F319 Bipolar disorder, unspecified: Secondary | ICD-10-CM

## 2022-11-09 DIAGNOSIS — G44229 Chronic tension-type headache, not intractable: Secondary | ICD-10-CM

## 2022-11-09 DIAGNOSIS — N3281 Overactive bladder: Secondary | ICD-10-CM

## 2022-11-09 DIAGNOSIS — Z1211 Encounter for screening for malignant neoplasm of colon: Secondary | ICD-10-CM

## 2022-11-09 DIAGNOSIS — I10 Essential (primary) hypertension: Secondary | ICD-10-CM

## 2022-11-09 NOTE — Patient Instructions (Signed)
Preventive Care 65 Years and Older, Female Preventive care refers to lifestyle choices and visits with your health care provider that can promote health and wellness. Preventive care visits are also called wellness exams. What can I expect for my preventive care visit? Counseling Your health care provider may ask you questions about your: Medical history, including: Past medical problems. Family medical history. Pregnancy and menstrual history. History of falls. Current health, including: Memory and ability to understand (cognition). Emotional well-being. Home life and relationship well-being. Sexual activity and sexual health. Lifestyle, including: Alcohol, nicotine or tobacco, and drug use. Access to firearms. Diet, exercise, and sleep habits. Work and work environment. Sunscreen use. Safety issues such as seatbelt and bike helmet use. Physical exam Your health care provider will check your: Height and weight. These may be used to calculate your BMI (body mass index). BMI is a measurement that tells if you are at a healthy weight. Waist circumference. This measures the distance around your waistline. This measurement also tells if you are at a healthy weight and may help predict your risk of certain diseases, such as type 2 diabetes and high blood pressure. Heart rate and blood pressure. Body temperature. Skin for abnormal spots. What immunizations do I need?  Vaccines are usually given at various ages, according to a schedule. Your health care provider will recommend vaccines for you based on your age, medical history, and lifestyle or other factors, such as travel or where you work. What tests do I need? Screening Your health care provider may recommend screening tests for certain conditions. This may include: Lipid and cholesterol levels. Hepatitis C test. Hepatitis B test. HIV (human immunodeficiency virus) test. STI (sexually transmitted infection) testing, if you are at  risk. Lung cancer screening. Colorectal cancer screening. Diabetes screening. This is done by checking your blood sugar (glucose) after you have not eaten for a while (fasting). Mammogram. Talk with your health care provider about how often you should have regular mammograms. BRCA-related cancer screening. This may be done if you have a family history of breast, ovarian, tubal, or peritoneal cancers. Bone density scan. This is done to screen for osteoporosis. Talk with your health care provider about your test results, treatment options, and if necessary, the need for more tests. Follow these instructions at home: Eating and drinking  Eat a diet that includes fresh fruits and vegetables, whole grains, lean protein, and low-fat dairy products. Limit your intake of foods with high amounts of sugar, saturated fats, and salt. Take vitamin and mineral supplements as recommended by your health care provider. Do not drink alcohol if your health care provider tells you not to drink. If you drink alcohol: Limit how much you have to 0-1 drink a day. Know how much alcohol is in your drink. In the [place] one drink equals one 12 oz bottle of beer (355 mL), one 5 oz glass of wine (148 mL), or one 1 oz glass of hard liquor (44 mL). Lifestyle Brush your teeth every morning and night with fluoride toothpaste. Floss one time each day. Exercise for at least 30 minutes 5 or more days each week. Do not use any products that contain nicotine or tobacco. These products include cigarettes, chewing tobacco, and vaping devices, such as e-cigarettes. If you need help quitting, ask your health care provider. Do not use drugs. If you are sexually active, practice safe sex. Use a condom or other form of protection in order to prevent STIs. Take aspirin only as told by   your health care provider. Make sure that you understand how much to take and what form to take. Work with your health care provider to find out whether it  is safe and beneficial for you to take aspirin daily. Ask your health care provider if you need to take a cholesterol-lowering medicine (statin). Find healthy ways to manage stress, such as: Meditation, yoga, or listening to music. Journaling. Talking to a trusted person. Spending time with friends and family. Minimize exposure to UV radiation to reduce your risk of skin cancer. Safety Always wear your seat belt while driving or riding in a vehicle. Do not drive: If you have been drinking alcohol. Do not ride with someone who has been drinking. When you are tired or distracted. While texting. If you have been using any mind-altering substances or drugs. Wear a helmet and other protective equipment during sports activities. If you have firearms in your house, make sure you follow all gun safety procedures. What's next? Visit your health care provider once a year for an annual wellness visit. Ask your health care provider how often you should have your eyes and teeth checked. Stay up to date on all vaccines. This information is not intended to replace advice given to you by your health care provider. Make sure you discuss any questions you have with your health care provider. Document Revised: 05/24/2021 Document Reviewed: 05/24/2021 Elsevier Patient Education  2023 Elsevier Inc.  
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
## 2022-11-10 LAB — CBC WITH DIFFERENTIAL/PLATELET
Absolute Monocytes: 364 cells/uL (ref 200–950)
Basophils Absolute: 42 cells/uL (ref 0–200)
Basophils Relative: 0.6 %
Eosinophils Absolute: 119 cells/uL (ref 15–500)
Eosinophils Relative: 1.7 %
HCT: 33.9 % — ABNORMAL LOW (ref 35.0–45.0)
Hemoglobin: 11.7 g/dL (ref 11.7–15.5)
Lymphs Abs: 2002 cells/uL (ref 850–3900)
MCH: 32 pg (ref 27.0–33.0)
MCHC: 34.5 g/dL (ref 32.0–36.0)
MCV: 92.6 fL (ref 80.0–100.0)
MPV: 9.5 fL (ref 7.5–12.5)
Monocytes Relative: 5.2 %
Neutro Abs: 4473 cells/uL (ref 1500–7800)
Neutrophils Relative %: 63.9 %
Platelets: 211 10*3/uL (ref 140–400)
RBC: 3.66 10*6/uL — ABNORMAL LOW (ref 3.80–5.10)
RDW: 13 % (ref 11.0–15.0)
Total Lymphocyte: 28.6 %
WBC: 7 10*3/uL (ref 3.8–10.8)

## 2022-11-10 LAB — COMPLETE METABOLIC PANEL WITH GFR
AG Ratio: 2.2 (calc) (ref 1.0–2.5)
ALT: 8 U/L (ref 6–29)
AST: 11 U/L (ref 10–35)
Albumin: 4.7 g/dL (ref 3.6–5.1)
Alkaline phosphatase (APISO): 178 U/L — ABNORMAL HIGH (ref 37–153)
BUN: 14 mg/dL (ref 7–25)
CO2: 29 mmol/L (ref 20–32)
Calcium: 9.2 mg/dL (ref 8.6–10.4)
Chloride: 104 mmol/L (ref 98–110)
Creat: 0.92 mg/dL (ref 0.60–1.00)
Globulin: 2.1 g/dL (calc) (ref 1.9–3.7)
Glucose, Bld: 87 mg/dL (ref 65–139)
Potassium: 3.9 mmol/L (ref 3.5–5.3)
Sodium: 143 mmol/L (ref 135–146)
Total Bilirubin: 0.6 mg/dL (ref 0.2–1.2)
Total Protein: 6.8 g/dL (ref 6.1–8.1)
eGFR: 67 mL/min/{1.73_m2} (ref >=60)

## 2022-11-10 LAB — TSH: TSH: 0.14 mIU/L — ABNORMAL LOW (ref 0.40–4.50)

## 2022-11-10 LAB — T3, FREE: T3, Free: 3.5 pg/mL (ref 2.3–4.2)

## 2022-11-10 LAB — T4, FREE: Free T4: 1.2 ng/dL (ref 0.8–1.8)

## 2022-11-16 ENCOUNTER — Other Ambulatory Visit: Payer: Self-pay | Admitting: Adult Health

## 2022-11-16 DIAGNOSIS — E890 Postprocedural hypothyroidism: Secondary | ICD-10-CM

## 2022-11-16 MED ORDER — LEVOTHYROXINE SODIUM 75 MCG PO TABS: 75.0000 ug | ORAL_TABLET | Freq: Every day | ORAL | 3 refills | Status: DC

## 2022-11-16 NOTE — Progress Notes (Signed)
Tsh lo, 0.14 (normal 0.4 to 4.50), sent new prescription to pharmacy. Tsh needs to be repeated on next visit. Alk phos 178, slightly elevate (normal 37 - 153), can be repeated next visit -  CBC normal

## 2022-11-20 ENCOUNTER — Encounter: Payer: Self-pay | Admitting: Adult Health

## 2022-11-20 DIAGNOSIS — M797 Fibromyalgia: Secondary | ICD-10-CM

## 2022-11-20 MED ORDER — PREGABALIN 50 MG PO CAPS: 50.0000 mg | ORAL_CAPSULE | Freq: Two times a day (BID) | ORAL | 5 refills | Status: DC

## 2022-11-20 NOTE — Telephone Encounter (Signed)
Patient requested refill.  Pended Rx and sent to [facility] for approval.

## 2022-11-21 ENCOUNTER — Telehealth: Payer: Self-pay

## 2022-11-21 ENCOUNTER — Encounter: Payer: Self-pay | Admitting: Adult Health

## 2022-11-21 NOTE — Telephone Encounter (Signed)
I called back and gave the approval.

## 2022-11-21 NOTE — Telephone Encounter (Signed)
Noted! Thank you

## 2022-11-21 NOTE — Telephone Encounter (Signed)
Message routed to PCP Medina-Vargas, Senaida Lange, NP

## 2022-11-21 NOTE — Telephone Encounter (Signed)
Medina-Vargas, Monina C, NP  You59 minutes ago (1:05 PM)    I have referred her to psychiatry on 11/09/22. FYI.

## 2022-11-21 NOTE — Telephone Encounter (Signed)
Kaitlyn from pill packing called and states that patient is going on vacation 11/29/2022-12/14/2021. She wanted to know if it was ok to give patient early supply of medication Lyrica '50mg'$ . Message routed to PCP Medina-Vargas, [place], NP .

## 2022-12-10 ENCOUNTER — Encounter: Payer: Self-pay | Admitting: Adult Health

## 2022-12-11 ENCOUNTER — Other Ambulatory Visit: Payer: Self-pay

## 2022-12-11 MED ORDER — FERROUS SULFATE 325 (65 FE) MG PO TABS: 325.0000 mg | ORAL_TABLET | Freq: Every day | ORAL | 1 refills | Status: DC

## 2022-12-13 ENCOUNTER — Ambulatory Visit: Payer: Medicare Other | Admitting: Adult Health

## 2022-12-17 ENCOUNTER — Encounter: Payer: Self-pay | Admitting: Adult Health

## 2022-12-17 ENCOUNTER — Ambulatory Visit (INDEPENDENT_AMBULATORY_CARE_PROVIDER_SITE_OTHER): Payer: Medicare Other | Admitting: Adult Health

## 2022-12-17 VITALS — BP 132/88 | HR 74 | Temp 94.4°F | Ht 63.0 in | Wt 164.0 lb

## 2022-12-17 DIAGNOSIS — Z23 Encounter for immunization: Secondary | ICD-10-CM

## 2022-12-17 DIAGNOSIS — Z122 Encounter for screening for malignant neoplasm of respiratory organs: Secondary | ICD-10-CM

## 2022-12-17 DIAGNOSIS — N3281 Overactive bladder: Secondary | ICD-10-CM

## 2022-12-17 DIAGNOSIS — I10 Essential (primary) hypertension: Secondary | ICD-10-CM | POA: Diagnosis not present

## 2022-12-17 DIAGNOSIS — R29818 Other symptoms and signs involving the nervous system: Secondary | ICD-10-CM

## 2022-12-17 DIAGNOSIS — F319 Bipolar disorder, unspecified: Secondary | ICD-10-CM | POA: Diagnosis not present

## 2022-12-17 DIAGNOSIS — M797 Fibromyalgia: Secondary | ICD-10-CM

## 2022-12-17 DIAGNOSIS — E890 Postprocedural hypothyroidism: Secondary | ICD-10-CM

## 2022-12-17 DIAGNOSIS — G44229 Chronic tension-type headache, not intractable: Secondary | ICD-10-CM

## 2022-12-17 MED ORDER — LISINOPRIL 40 MG PO TABS: 40.0000 mg | ORAL_TABLET | Freq: Every day | ORAL | 3 refills | Status: DC

## 2022-12-17 NOTE — Patient Instructions (Signed)

## 2022-12-17 NOTE — Progress Notes (Unsigned)
[facility] clinic  Provider: Durenda Age DNP  Code Status:  Full Code  Goals of Care:     11/09/2022    9:45 AM  Advanced Directives  Does Patient Have a Medical Advance Directive? No  Would patient like information on creating a medical advance directive? No - Patient declined     Chief Complaint  Patient presents with   Medical Management of Chronic Issues    Patient presents today for 1 month follow-up   Quality Metric Gaps    AWV,colonoscopy, DEXA scan, mammogram, Hep C screening, COVID#5    HPI: Patient is a 72 y.o. female seen today for medical  Past Medical History:  Diagnosis Date   Alzheimer disease ([place])    Bipolar 1 disorder ([place])    CSF leak    TBI (traumatic brain injury) ([place])    TIA (transient ischemic attack)    Vitamin B6 deficiency    Vitamin D deficiency     Past Surgical History:  Procedure Laterality Date   ABDOMINAL HYSTERECTOMY     RECTOVAGINAL FISTULA CLOSURE     ROTATOR CUFF REPAIR      Allergies  Allergen Reactions   Latex Rash   Other Rash    Tape made her have blisters   Wound Dressing Adhesive Rash    Outpatient Encounter Medications as of 12/17/2022  Medication Sig   ascorbic acid (VITAMIN C) 500 MG tablet Take 500 mg by mouth 2 (two) times daily.   atorvastatin (LIPITOR) 40 MG tablet Take by mouth.   donepezil (ARICEPT) 10 MG tablet Take 10 mg by mouth at bedtime.   ferrous sulfate 325 (65 FE) MG tablet Take 1 tablet (325 mg total) by mouth daily.   ketoconazole (NIZORAL) 2 % cream Apply 1 Application topically daily.   lamoTRIgine (LAMICTAL) 200 MG tablet Take 200 mg by mouth daily.   levothyroxine (SYNTHROID) 75 MCG tablet Take 1 tablet (75 mcg total) by mouth daily.   lisinopril (ZESTRIL) 20 MG tablet Take by mouth.   loratadine (CLARITIN) 10 MG tablet Take 10 mg by mouth daily.   melatonin 3 MG TABS tablet Take 3 mg by mouth at bedtime.   mirabegron ER (MYRBETRIQ) 50 MG TB24 tablet Take 50 mg by mouth daily.    Omega-3 Fatty Acids (FISH OIL) 1000 MG CAPS Take 1 capsule by mouth daily.   pregabalin (LYRICA) 50 MG capsule Take 1 capsule (50 mg total) by mouth 2 (two) times daily.   traZODone (DESYREL) 50 MG tablet Take 50 mg by mouth at bedtime.   trospium (SANCTURA) 20 MG tablet Take 20 mg by mouth 2 (two) times daily.   venlafaxine XR (EFFEXOR-XR) 150 MG 24 hr capsule Take by mouth.   No facility-administered encounter medications on file as of 12/17/2022.    Review of Systems:  Review of Systems  Constitutional:  Negative for appetite change, chills, fatigue and fever.  HENT:  Negative for congestion, hearing loss, rhinorrhea and sore throat.   Eyes: Negative.   Respiratory:  Negative for cough, shortness of breath and wheezing.   Cardiovascular:  Negative for chest pain, palpitations and leg swelling.  Gastrointestinal:  Negative for abdominal pain, constipation, diarrhea, nausea and vomiting.  Genitourinary:  Negative for dysuria.  Musculoskeletal:  Negative for arthralgias, back pain and myalgias.  Skin:  Negative for color change, rash and wound.  Neurological:  Negative for dizziness, weakness and headaches.  Psychiatric/Behavioral:  Negative for behavioral problems. The patient is not nervous/anxious.  Health Maintenance  Topic Date Due   Medicare Annual Wellness (AWV)  Never done   Hepatitis C Screening  Never done   COLONOSCOPY (Pts 45-54yr Insurance coverage will need to be confirmed)  Never done   Lung Cancer Screening  Never done   MAMMOGRAM  Never done   DEXA SCAN  Never done   INFLUENZA VACCINE  07/10/2022   COVID-19 Vaccine (5 - 2023-24 season) 08/10/2022   DTaP/Tdap/Td (3 - Td or Tdap) 03/14/2032   Pneumonia Vaccine 72 Years old  Completed   Zoster Vaccines- Shingrix  Completed   HPV VACCINES  Aged Out    Physical Exam: Vitals:   12/17/22 1515  BP: 132/88  Pulse: 74  Temp: (!) 94.4 F (34.7 C)  SpO2: 95%  Weight: 164 lb (74.4 kg)  Height: '5\' 3"'$  (1.6 m)    Body mass index is 29.05 kg/m. Physical Exam Constitutional:      General: She is not in acute distress.    Appearance: Normal appearance.  HENT:     Head: Normocephalic and atraumatic.     Nose: Nose normal.     Mouth/Throat:     Mouth: Mucous membranes are moist.  Eyes:     Conjunctiva/sclera: Conjunctivae normal.  Cardiovascular:     Rate and Rhythm: Normal rate and regular rhythm.  Pulmonary:     Effort: Pulmonary effort is normal.     Breath sounds: Normal breath sounds.  Abdominal:     General: Bowel sounds are normal.     Palpations: Abdomen is soft.  Musculoskeletal:        General: Normal range of motion.     Cervical back: Normal range of motion.  Skin:    General: Skin is warm and dry.  Neurological:     Mental Status: She is alert and oriented to person, place, and time. Mental status is at baseline.  Psychiatric:        Mood and Affect: Mood normal.        Behavior: Behavior normal.     Labs reviewed: Basic Metabolic Panel: Recent Labs    11/09/22 1418  NA 143  K 3.9  CL 104  CO2 29  GLUCOSE 87  BUN 14  CREATININE 0.92  CALCIUM 9.2  TSH 0.14*   Liver Function Tests: Recent Labs    11/09/22 1418  AST 11  ALT 8  BILITOT 0.6  PROT 6.8   No results for input(s): "LIPASE", "AMYLASE" in the last 8760 hours. No results for input(s): "AMMONIA" in the last 8760 hours. CBC: Recent Labs    11/09/22 1418  WBC 7.0  NEUTROABS 4,473  HGB 11.7  HCT 33.9*  MCV 92.6  PLT 211   Lipid Panel: No results for input(s): "CHOL", "HDL", "LDLCALC", "TRIG", "CHOLHDL", "LDLDIRECT" in the last 8760 hours. No results found for: "HGBA1C"  Procedures since last visit: No results found.  Assessment/Plan     Labs/tests ordered:  CT chest  Next appt:  Visit date not found

## 2023-01-03 ENCOUNTER — Ambulatory Visit (HOSPITAL_COMMUNITY): Payer: Medicare Other | Admitting: Psychiatry

## 2023-01-11 ENCOUNTER — Ambulatory Visit
Admission: RE | Admit: 2023-01-11 | Discharge: 2023-01-11 | Disposition: A | Discharge status: Home / Self Care | Payer: Medicare Other | Source: Ambulatory Visit | Attending: Adult Health | Admitting: Adult Health

## 2023-01-11 DIAGNOSIS — Z122 Encounter for screening for malignant neoplasm of respiratory organs: Secondary | ICD-10-CM

## 2023-01-14 ENCOUNTER — Other Ambulatory Visit: Payer: Self-pay

## 2023-01-14 ENCOUNTER — Encounter: Payer: Self-pay | Admitting: Adult Health

## 2023-01-14 ENCOUNTER — Encounter: Payer: Self-pay | Admitting: Urology

## 2023-01-14 ENCOUNTER — Ambulatory Visit: Payer: Medicare Other | Admitting: Urology

## 2023-01-14 VITALS — BP 169/93 | HR 77 | Ht 63.0 in | Wt 162.0 lb

## 2023-01-14 DIAGNOSIS — N3281 Overactive bladder: Secondary | ICD-10-CM

## 2023-01-14 DIAGNOSIS — F319 Bipolar disorder, unspecified: Secondary | ICD-10-CM

## 2023-01-14 DIAGNOSIS — N3946 Mixed incontinence: Secondary | ICD-10-CM | POA: Diagnosis not present

## 2023-01-14 LAB — MICROSCOPIC EXAMINATION: WBC, UA: >30 /hpf — AB (ref 0–5)

## 2023-01-14 LAB — URINALYSIS, COMPLETE
Bilirubin, UA: NEGATIVE
Glucose, UA: NEGATIVE
Ketones, UA: NEGATIVE
Nitrite, UA: POSITIVE — AB
RBC, UA: NEGATIVE
Specific Gravity, UA: 1.02 (ref 1.005–1.030)
Urobilinogen, Ur: 1 mg/dL (ref 0.2–1.0)
pH, UA: 5 (ref 5.0–7.5)

## 2023-01-14 MED ORDER — LAMOTRIGINE 200 MG PO TABS: 200.0000 mg | ORAL_TABLET | Freq: Every day | ORAL | 1 refills | Status: DC

## 2023-01-14 MED ORDER — ATORVASTATIN CALCIUM 40 MG PO TABS: 40.0000 mg | ORAL_TABLET | Freq: Every day | ORAL | 1 refills | Status: DC

## 2023-01-14 NOTE — Progress Notes (Signed)
01/14/2023 1:12 PM   Shannon Hart [birth year] [id number]  Referring provider: Nickola Major, NP [address],  [place] [address]  No chief complaint on file.   HPI: I was consulted to assess the patient's urinary incontinence.  She primarily has urge incontinence.  She can leak sometimes with coughing sneezing.  She has moderate to severe bedwetting.  She wears 5-8 pads a day moderately wet  She voids every 2 hours gets up to 3 times a night.  Flow was good.  She has had a previous bladder suspension.  It sounds like she may be having a planned bladder repair in [facility] but she is moving to [place] where her daughter lives.  I reviewed the medical records and she is likely had at least 1 prolapse repair and perhaps an abdominal repair with mesh.  She is also been tried on Gemtesa in the past.  I looked at the urology notes from 2023 and they talked her about the 3 refractory OAB therapies and were going to consult for a pessary to gynecology  She has had a TIA and a hysterectomy.  She has a history of traumatic brain injury and or stroke.  She is currently on Myrbetriq and trospium with no benefit.  She has had no other treatments.   No history of kidney stones or bladder infections.  Bowel movements reasonably normal   PMH: Past Medical History:  Diagnosis Date   Alzheimer disease ([place])    Bipolar 1 disorder ([place])    CSF leak    TBI (traumatic brain injury) ([place])    TIA (transient ischemic attack)    Vitamin B6 deficiency    Vitamin D deficiency     Surgical History: Past Surgical History:  Procedure Laterality Date   ABDOMINAL HYSTERECTOMY     RECTOVAGINAL FISTULA CLOSURE     ROTATOR CUFF REPAIR      Home Medications:  Allergies as of 01/14/2023       Reactions   Latex Rash   Other Rash   Tape made her have blisters   Wound Dressing Adhesive Rash        Medication List        Accurate as of January 14, 2023  1:12 PM. If you have  any questions, ask your nurse or doctor.          ascorbic acid 500 MG tablet Commonly known as: VITAMIN C Take 500 mg by mouth 2 (two) times daily.   atorvastatin 40 MG tablet Commonly known as: LIPITOR Take by mouth.   donepezil 10 MG tablet Commonly known as: ARICEPT Take 10 mg by mouth at bedtime.   ferrous sulfate 325 (65 FE) MG tablet Take 1 tablet (325 mg total) by mouth daily.   Fish Oil 1000 MG Caps Take 1 capsule by mouth daily.   ketoconazole 2 % cream Commonly known as: NIZORAL Apply 1 Application topically daily.   lamoTRIgine 200 MG tablet Commonly known as: LAMICTAL Take 200 mg by mouth daily.   levothyroxine 75 MCG tablet Commonly known as: SYNTHROID Take 1 tablet (75 mcg total) by mouth daily.   lisinopril 40 MG tablet Commonly known as: ZESTRIL Take 1 tablet (40 mg total) by mouth daily.   loratadine 10 MG tablet Commonly known as: CLARITIN Take 10 mg by mouth daily.   melatonin 3 MG Tabs tablet Take 3 mg by mouth at bedtime.   Myrbetriq 50 MG Tb24 tablet Generic drug: mirabegron ER Take  50 mg by mouth daily.   pregabalin 50 MG capsule Commonly known as: LYRICA Take 1 capsule (50 mg total) by mouth 2 (two) times daily.   traZODone 50 MG tablet Commonly known as: DESYREL Take 50 mg by mouth at bedtime.   trospium 20 MG tablet Commonly known as: SANCTURA Take 20 mg by mouth 2 (two) times daily.   venlafaxine XR 150 MG 24 hr capsule Commonly known as: EFFEXOR-XR Take by mouth.        Allergies:  Allergies  Allergen Reactions   Latex Rash   Other Rash    Tape made her have blisters   Wound Dressing Adhesive Rash    Family History: No family history on file.  Social History:  reports that she has been smoking cigarettes. She has never used smokeless tobacco. She reports that she does not currently use alcohol. She reports that she does not currently use drugs.  ROS:                                         Physical Exam: There were no vitals taken for this visit.  Constitutional:  Alert and oriented, No acute distress. HEENT: [place] AT, moist mucus membranes.  Trachea midline, no masses. Cardiovascular: No clubbing, cyanosis, or edema. Respiratory: Normal respiratory effort, no increased work of breathing. GI: Abdomen is soft, nontender, nondistended, no abdominal masses GU: On pelvic examination she had mild shortening of the vagina to approximately 7 cm.  She had a high small grade 1 cystocele with no central defect.  Little to no rectocele.  Reasonly well supported bladder neck with no stress incontinence Skin: No rashes, bruises or suspicious lesions. Lymph: No cervical or inguinal adenopathy. Neurologic: Grossly intact, no focal deficits, moving all 4 extremities. Psychiatric: Normal mood and affect.  Laboratory Data: Lab Results  Component Value Date   WBC 7.0 11/09/2022   HGB 11.7 11/09/2022   HCT 33.9 (L) 11/09/2022   MCV 92.6 11/09/2022   PLT 211 11/09/2022    Lab Results  Component Value Date   CREATININE 0.92 11/09/2022    No results found for: "PSA"  No results found for: "TESTOSTERONE"  No results found for: "HGBA1C"  Urinalysis    Component Value Date/Time   COLORURINE YELLOW (A) 06/15/2021 1346   APPEARANCEUR HAZY (A) 06/15/2021 1346   LABSPEC 1.011 06/15/2021 1346   PHURINE 5.0 06/15/2021 1346   GLUCOSEU NEGATIVE 06/15/2021 1346   HGBUR NEGATIVE 06/15/2021 1346   BILIRUBINUR NEGATIVE 06/15/2021 1346   KETONESUR NEGATIVE 06/15/2021 1346   PROTEINUR NEGATIVE 06/15/2021 1346   NITRITE NEGATIVE 06/15/2021 1346   LEUKOCYTESUR TRACE (A) 06/15/2021 1346    Pertinent Imaging: Urine reviewed.  Urine sent for culture.  Chart reviewed  Assessment & Plan: Patient has refractory mixed incontinence with primarily high-volume urge incontinence.  She has bedwetting frequency and nocturia.  She will return with urodynamics and cystoscopy.  She has  neurogenic bladder risk factors.  Call if culture positive.  She has hide treatment goals and is hoping to get helped  There are no diagnoses linked to this encounter.  No follow-ups on file.  Reece Packer, MD  [facility] [address], [place], [place] [address] [phone]

## 2023-01-16 NOTE — Progress Notes (Signed)
CT lung showed benign appearance, need to continue annual screening.

## 2023-01-17 ENCOUNTER — Ambulatory Visit: Payer: Medicare Other | Admitting: Neurology

## 2023-01-17 ENCOUNTER — Ambulatory Visit (AMBULATORY_SURGERY_CENTER): Payer: Medicare Other

## 2023-01-17 VITALS — Ht 63.0 in | Wt 165.0 lb

## 2023-01-17 VITALS — BP 167/93 | HR 67 | Ht 63.0 in | Wt 161.0 lb

## 2023-01-17 DIAGNOSIS — F39 Unspecified mood [affective] disorder: Secondary | ICD-10-CM | POA: Diagnosis not present

## 2023-01-17 DIAGNOSIS — R413 Other amnesia: Secondary | ICD-10-CM

## 2023-01-17 DIAGNOSIS — Z1211 Encounter for screening for malignant neoplasm of colon: Secondary | ICD-10-CM

## 2023-01-17 LAB — CULTURE, URINE COMPREHENSIVE

## 2023-01-17 MED ORDER — DONEPEZIL HCL 10 MG PO TABS: 10.0000 mg | ORAL_TABLET | Freq: Every day | ORAL | 3 refills | Status: DC

## 2023-01-17 MED ORDER — NA SULFATE-K SULFATE-MG SULF 17.5-3.13-1.6 GM/177ML PO SOLN: 1.0000 | Freq: Once | ORAL | 0 refills | Status: AC

## 2023-01-17 NOTE — Progress Notes (Signed)

## 2023-01-17 NOTE — Progress Notes (Signed)
Chief Complaint  Patient presents with   New Patient (Initial Visit)    Rm 17. Patient is with daughter, daughter states tests were completed at [facility] about 6 months ago. Stated they were told she had decreased gray matter. They are transferring all of her care to [facility] and this is the initial visit. Just want to manage her meds and care here.       ASSESSMENT AND PLAN  Shannon Hart is a 72 y.o. 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   MEDICAL HISTORY:  Shannon Hart is a 72 year old female, seen in request by her primary care from [place] practitioner Medina-Vargas, Senaida Lange, for evaluation of memory loss, initial evaluation was with her daughter on January 17, 2023  I reviewed and summarized the referring note. PMHx. 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 , but 8 hours driving took her 24 hours, she was diagnosed with  traumatic brain injury, along with her mood disorder, she went on disability, has seen by psychiatrist for many years, on polypharmacy treatment, was given the diagnosis of bipolar disorder, OCD, not taking lamotrigine 200 mg daily, Lyrica 50 mg twice a day, trazodone 50 mg daily,  She complains of difficulty sleeping, up-and-down frequently at nighttime, difficulty focusing, difficulty having her test complete, memory loss,  Reported recent evaluation at [facility], neuropsychology evaluation confirmed cognitive impairment, mainly affecting her executive function, attention deficit, I do not find the formal report  MRI of the brain with and without contrast from [facility] in January 2023 showed no acute intracranial abnormality, 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  PHYSICAL EXAMNIATION:  Gen: NAD, conversant, well nourised, well groomed                     Cardiovascular: Regular rate rhythm, no peripheral edema, warm, nontender. Eyes: Conjunctivae clear without exudates or hemorrhage Neck: Supple, no carotid bruits. Pulmonary:  Clear to auscultation bilaterally   NEUROLOGICAL EXAM:  MENTAL STATUS: Speech/cognition: Anxious looking elderly female, awake, alert, oriented to history taking and casual conversation, has to be redirected during the conversation multiple times     01/17/2023    4:00 PM  Montreal Cognitive Assessment   Visuospatial/ Executive (0/5) 5  Naming  (0/3) 3  Attention: Read list of digits (0/2) 2  Attention: Read list of letters (0/1) 1  Attention: Serial 7 subtraction starting at 100 (0/3) 1  Language: Repeat phrase (0/2) 1  Language : Fluency (0/1) 0  Abstraction (0/2) 2  Delayed Recall (0/5) 2  Orientation (0/6) 6  Total 23      CRANIAL NERVES: CN II: Visual fields are full to confrontation. Pupils are round equal and briskly reactive to light. CN III, IV, VI: extraocular movement are normal. No ptosis. CN V: Facial sensation is intact to light touch CN VII: Face is symmetric with normal eye closure  CN VIII: Hearing is normal to causal conversation. CN IX, X: Phonation is normal. CN XI: Head turning and shoulder shrug are intact  MOTOR: There is no pronator drift of out-stretched arms. Muscle bulk and tone are normal. Muscle strength is normal.  REFLEXES: Reflexes are 2+ and symmetric at the biceps, triceps, knees, and ankles. Plantar responses are flexor.  SENSORY: Intact to light touch, pinprick and vibratory sensation are intact in fingers and toes.  COORDINATION: There is no trunk or limb dysmetria noted.  GAIT/STANCE: Posture is normal. Gait is steady with normal steps, base, arm swing, and turning. Heel and toe walking are normal. Tandem gait is normal.  Romberg is absent.  REVIEW OF SYSTEMS:  Full 14 system review of systems performed and notable only for as above All other review of systems were negative.   ALLERGIES: Allergies  Allergen Reactions   Latex Rash   Other Rash    Tape made her have blisters   Wound Dressing Adhesive Rash    HOME MEDICATIONS: Current Outpatient Medications  Medication Sig Dispense Refill   ascorbic acid (VITAMIN C) 500 MG tablet Take 500 mg by mouth 2 (two) times daily.     atorvastatin (LIPITOR) 40 MG tablet Take 1 tablet (40 mg total) by mouth daily. 90 tablet 1   donepezil (ARICEPT) 10 MG tablet Take 10 mg by mouth at bedtime.     ferrous sulfate 325 (65 FE) MG  tablet Take 1 tablet (325 mg total) by mouth daily. 90 tablet 1   ketoconazole (NIZORAL) 2 % cream Apply 1 Application topically daily.     lamoTRIgine (LAMICTAL) 200 MG tablet Take 1 tablet (200 mg total) by mouth daily. 90 tablet 1   levothyroxine (SYNTHROID) 75 MCG tablet Take 1 tablet (75 mcg total) by mouth daily. 90 tablet 3   lisinopril (ZESTRIL) 40 MG tablet Take 1 tablet (40 mg total) by mouth daily. 90 tablet 3   loratadine (CLARITIN) 10 MG tablet Take 10 mg by mouth daily.     melatonin 3 MG TABS tablet Take 3 mg by mouth at bedtime.     mirabegron ER (MYRBETRIQ) 50 MG TB24 tablet Take 50 mg by mouth daily.     Na Sulfate-K Sulfate-Mg Sulf 17.5-3.13-1.6 GM/177ML SOLN Take 1 kit by mouth once for 1 dose. May use generic Suprep, no Prior Authorization; Use Singlecare or Good RX. 354 mL 0   Omega-3 Fatty Acids (FISH OIL) 1000 MG CAPS Take 1 capsule by mouth daily.     pregabalin (  LYRICA) 50 MG capsule Take 1 capsule (50 mg total) by mouth 2 (two) times daily. 60 capsule 5   traZODone (DESYREL) 50 MG tablet Take 50 mg by mouth at bedtime.     trospium (SANCTURA) 20 MG tablet Take 20 mg by mouth 2 (two) times daily.     venlafaxine XR (EFFEXOR-XR) 150 MG 24 hr capsule Take by mouth.     No current facility-administered medications for this visit.    PAST MEDICAL HISTORY: Past Medical History:  Diagnosis Date   Allergy    Alzheimer disease ([place])    Anxiety    Arthritis    Bipolar 1 disorder ([place])    Cancer (H[facility])    Cataract    CSF leak    Depression    Hyperlipidemia    Hypertension    Neuromuscular disorder ([facility])    Sleep apnea    TBI (traumatic brain injury) ([place])    Thyroid disease    TIA (transient ischemic attack)    Vitamin B6 deficiency    Vitamin D deficiency     PAST SURGICAL HISTORY: Past Surgical History:  Procedure Laterality Date   ABDOMINAL HYSTERECTOMY     DG SHOULDER RIGHT COMPLETE  2023   RECTOVAGINAL FISTULA CLOSURE     ROTATOR CUFF REPAIR      THYROIDECTOMY  2004    FAMILY HISTORY: Family History  Problem Relation Age of Onset   Colon cancer Neg Hx    Colon polyps Neg Hx    Esophageal cancer Neg Hx    Rectal cancer Neg Hx    Stomach cancer Neg Hx     SOCIAL HISTORY: Social History   Socioeconomic History   Marital status: Divorced    Spouse name: Not on file   Number of children: Not on file   Years of education: Not on file   Highest education level: Not on file  Occupational History   Not on file  Tobacco Use   Smoking status: Every Day    Types: Cigarettes    Passive exposure: Current   Smokeless tobacco: Never  Substance and Sexual Activity   Alcohol use: Not Currently   Drug use: Not Currently   Sexual activity: Not Currently  Other Topics Concern   Not on file  Social History Narrative   Not on file   Social Determinants of Health   Financial Resource Strain: Not on file  Food Insecurity: Not on file  Transportation Needs: Not on file  Physical Activity: Not on file  Stress: Not on file  Social Connections: Not on file  Intimate Partner Violence: Not on file      Marcial Pacas, M.D. Ph.D.  [facility] [address], [place], [place] [address] Ph: [phone] Fax: [phone]  CC:  Medina-Vargas, Monina C, NP [address],  [place] [address]  Medina-Vargas, Senaida Lange, NP

## 2023-01-18 ENCOUNTER — Encounter: Payer: Self-pay | Admitting: Gastroenterology

## 2023-01-18 LAB — VITAMIN B12: Vitamin B-12: 449 pg/mL (ref 232–1245)

## 2023-01-18 LAB — C-REACTIVE PROTEIN: CRP: 1 mg/L (ref 0–10)

## 2023-01-18 LAB — HIV ANTIBODY (ROUTINE TESTING W REFLEX): HIV Screen 4th Generation wRfx: NONREACTIVE

## 2023-01-18 LAB — SEDIMENTATION RATE: Sed Rate: 4 mm/hr (ref 0–40)

## 2023-01-18 LAB — RPR: RPR Ser Ql: NONREACTIVE

## 2023-01-21 ENCOUNTER — Telehealth: Payer: Self-pay

## 2023-01-21 MED ORDER — NITROFURANTOIN MACROCRYSTAL 100 MG PO CAPS: 100.0000 mg | ORAL_CAPSULE | Freq: Two times a day (BID) | ORAL | 0 refills | Status: DC

## 2023-01-21 NOTE — Telephone Encounter (Signed)
Message left on clinical intake voicemail:   Patient needs a refill on Vit D 1.25 mg a fax was sent on 01/21/23. It was indicated that we can reply to request via fax, electronically, or phone call.  I returned call to the [facility] and left a detailed message informing the pharmacy staff that the requested medication is not on patients active medication list and the patient would need to contact us for an appointment to further discuss.

## 2023-01-21 NOTE — Telephone Encounter (Signed)
-----   Message from Bjorn Loser, MD sent at 01/18/2023  7:11 AM EST ----- Macrodantin 100 mg bid for 7 days ----- Message ----- From: Kris Mouton, CMA Sent: 01/17/2023   1:37 PM EST To: Bjorn Loser, MD   ----- Message ----- From: Interface, [facility] Lab Results In Sent: 01/14/2023   4:36 PM EST To: Rowe Robert Clinical

## 2023-01-21 NOTE — Telephone Encounter (Signed)
Spoke with Janett Billow, ok per DPR on file, advised of the results. RX sent in

## 2023-02-04 ENCOUNTER — Ambulatory Visit: Payer: Medicare Other | Admitting: Neurology

## 2023-02-04 DIAGNOSIS — F39 Unspecified mood [affective] disorder: Secondary | ICD-10-CM

## 2023-02-04 DIAGNOSIS — R4182 Altered mental status, unspecified: Secondary | ICD-10-CM | POA: Diagnosis not present

## 2023-02-04 DIAGNOSIS — R413 Other amnesia: Secondary | ICD-10-CM

## 2023-02-06 ENCOUNTER — Encounter: Payer: Self-pay | Admitting: Certified Registered Nurse Anesthetist

## 2023-02-06 ENCOUNTER — Encounter: Payer: Self-pay | Admitting: Adult Health

## 2023-02-06 DIAGNOSIS — G629 Polyneuropathy, unspecified: Secondary | ICD-10-CM

## 2023-02-07 ENCOUNTER — Telehealth: Payer: Self-pay | Admitting: *Deleted

## 2023-02-07 ENCOUNTER — Encounter: Payer: Medicare Other | Admitting: Gastroenterology

## 2023-02-07 DIAGNOSIS — Z1211 Encounter for screening for malignant neoplasm of colon: Secondary | ICD-10-CM

## 2023-02-07 MED ORDER — NA SULFATE-K SULFATE-MG SULF 17.5-3.13-1.6 GM/177ML PO SOLN: 1.0000 | Freq: Once | ORAL | 0 refills | Status: AC

## 2023-02-07 NOTE — Telephone Encounter (Signed)
Spoke to pt's daughter and she reports that pt has followed prep instructions completely including stopping fiber 5 days prior to procedure. She has been on a liquid diet for 2 1/2 days per daughter. She drank the prep last night and has been drinking fluids per instructions but she has not had any bowel movement since drinking the prep. Per pt's daughter the pt is not complaining of any pain at this time. Instructed daughter that if pt develops abdominal pain or distension she will need to seek care at an ED for evaluation. Daughter verbalized understanding. Rescheduled pt for Monday 02/18/23 at 2:30pm. New instructions sent via MyChart, daughter states she has access to it, and new prescription for bowel prep sent to preferred pharmacy. MD made aware. Instructed daughter to call if she has any questions about the prep instructions after she reviews them.

## 2023-02-07 NOTE — Telephone Encounter (Signed)
Ok, thank you

## 2023-02-13 ENCOUNTER — Ambulatory Visit (HOSPITAL_COMMUNITY): Payer: Medicare Other | Admitting: Psychiatry

## 2023-02-18 ENCOUNTER — Encounter: Payer: Self-pay | Admitting: Gastroenterology

## 2023-02-18 ENCOUNTER — Ambulatory Visit (AMBULATORY_SURGERY_CENTER): Payer: Medicare Other | Admitting: Gastroenterology

## 2023-02-18 VITALS — BP 160/80 | HR 61 | Temp 98.4°F | Resp 14 | Ht 63.0 in | Wt 165.0 lb

## 2023-02-18 DIAGNOSIS — Z1211 Encounter for screening for malignant neoplasm of colon: Secondary | ICD-10-CM

## 2023-02-18 DIAGNOSIS — K635 Polyp of colon: Secondary | ICD-10-CM | POA: Diagnosis not present

## 2023-02-18 DIAGNOSIS — D122 Benign neoplasm of ascending colon: Secondary | ICD-10-CM

## 2023-02-18 DIAGNOSIS — D123 Benign neoplasm of transverse colon: Secondary | ICD-10-CM | POA: Diagnosis not present

## 2023-02-18 MED ORDER — SODIUM CHLORIDE 0.9 % IV SOLN: 500.0000 mL | INTRAVENOUS | Status: DC

## 2023-02-18 NOTE — Patient Instructions (Addendum)
Resume previous diet Continue present medications Use miralax one capful daily in 8 ounces of liquid Repeat colonoscopy in one year due to sub-optimal prep.    Handouts/information given for polyps, diverticulosis and hemorrhoids  YOU HAD AN ENDOSCOPIC PROCEDURE TODAY AT [facility]:   Refer to the procedure report that was given to you for any specific questions about what was found during the examination.  If the procedure report does not answer your questions, please call your gastroenterologist to clarify.  If you requested that your care partner not be given the details of your procedure findings, then the procedure report has been included in a sealed envelope for you to review at your convenience later.  YOU SHOULD EXPECT: Some feelings of bloating in the abdomen. Passage of more gas than usual.  Walking can help get rid of the air that was put into your GI tract during the procedure and reduce the bloating. If you had a lower endoscopy (such as a colonoscopy or flexible sigmoidoscopy) you may notice spotting of blood in your stool or on the toilet paper. If you underwent a bowel prep for your procedure, you may not have a normal bowel movement for a few days.  Please Note:  You might notice some irritation and congestion in your nose or some drainage.  This is from the oxygen used during your procedure.  There is no need for concern and it should clear up in a day or so.  SYMPTOMS TO REPORT IMMEDIATELY:  Following lower endoscopy (colonoscopy):  Excessive amounts of blood in the stool  Significant tenderness or worsening of abdominal pains  Swelling of the abdomen that is new, acute  Fever of 100F or higher  For urgent or emergent issues, a gastroenterologist can be reached at any hour by calling [phone]. Do not use MyChart messaging for urgent concerns.   DIET:  We do recommend a small meal at first, but then you may proceed to your regular diet.  Drink  plenty of fluids but you should avoid alcoholic beverages for 24 hours.  ACTIVITY:  You should plan to take it easy for the rest of today and you should NOT DRIVE or use heavy machinery until tomorrow (because of the sedation medicines used during the test).    FOLLOW UP: Our staff will call the number listed on your records the next business day following your procedure.  We will call around 7:15- 8:00 am to check on you and address any questions or concerns that you may have regarding the information given to you following your procedure. If we do not reach you, we will leave a message.     If any biopsies were taken you will be contacted by phone or by letter within the next 1-3 weeks.  Please call us at [phone] if you have not heard about the biopsies in 3 weeks.   SIGNATURES/CONFIDENTIALITY: You and/or your care partner have signed paperwork which will be entered into your electronic medical record.  These signatures attest to the fact that that the information above on your After Visit Summary has been reviewed and is understood.  Full responsibility of the confidentiality of this discharge information lies with you and/or your care-partner.

## 2023-02-18 NOTE — Progress Notes (Unsigned)
[facility] History and Physical   Primary Care Physician:  Nickola Major, NP   Reason for Procedure:  Colorectal cancer screening  Plan:    Screening colonoscopy with possible interventions as needed     HPI: Shannon Hart is a very pleasant 72 y.o. female here for screening colonoscopy. Denies any nausea, vomiting, abdominal pain, melena or bright red blood per rectum  The risks and benefits as well as alternatives of endoscopic procedure(s) have been discussed and reviewed. All questions answered. The patient agrees to proceed.    Past Medical History:  Diagnosis Date   Allergy    Alzheimer disease ([place])    Anxiety    Arthritis    Bipolar 1 disorder ([place])    Cancer ([place])    Cataract    CSF leak    Depression    Hyperlipidemia    Hypertension    Neuromuscular disorder ([place])    Sleep apnea    TBI (traumatic brain injury) ([place])    Thyroid disease    TIA (transient ischemic attack)    Vitamin B6 deficiency    Vitamin D deficiency     Past Surgical History:  Procedure Laterality Date   ABDOMINAL HYSTERECTOMY     DG SHOULDER RIGHT COMPLETE  2023   RECTOVAGINAL FISTULA CLOSURE     ROTATOR CUFF REPAIR     THYROIDECTOMY  2004    Prior to Admission medications   Medication Sig Start Date End Date Taking? Authorizing Provider  ascorbic acid (VITAMIN C) 500 MG tablet Take 500 mg by mouth 2 (two) times daily.   Yes [provider]  atorvastatin (LIPITOR) 40 MG tablet Take 1 tablet (40 mg total) by mouth daily. 01/14/23  Yes Medina-Vargas, Monina C, NP  donepezil (ARICEPT) 10 MG tablet Take 1 tablet (10 mg total) by mouth at bedtime. 01/17/23  Yes Marcial Pacas, MD  ferrous sulfate 325 (65 FE) MG tablet Take 1 tablet (325 mg total) by mouth daily. 12/11/22  Yes Medina-Vargas, Monina C, NP  lamoTRIgine (LAMICTAL) 200 MG tablet Take 1 tablet (200 mg total) by mouth daily. 01/14/23  Yes Medina-Vargas, Monina C, NP  levothyroxine (SYNTHROID) 75 MCG tablet  Take 1 tablet (75 mcg total) by mouth daily. 11/16/22  Yes Medina-Vargas, Monina C, NP  lisinopril (ZESTRIL) 40 MG tablet Take 1 tablet (40 mg total) by mouth daily. 12/17/22  Yes Medina-Vargas, Monina C, NP  loratadine (CLARITIN) 10 MG tablet Take 10 mg by mouth daily.   Yes [provider]  melatonin 3 MG TABS tablet Take 3 mg by mouth at bedtime.   Yes [provider]  mirabegron ER (MYRBETRIQ) 50 MG TB24 tablet Take 50 mg by mouth daily.   Yes [provider]  nitrofurantoin (MACRODANTIN) 100 MG capsule Take 1 capsule (100 mg total) by mouth 2 (two) times daily. 01/21/23  Yes MacDiarmid, Nicki Reaper, MD  Omega-3 Fatty Acids (FISH OIL) 1000 MG CAPS Take 1 capsule by mouth daily.   Yes [provider]  pregabalin (LYRICA) 50 MG capsule Take 1 capsule (50 mg total) by mouth 2 (two) times daily. 11/20/22  Yes Medina-Vargas, Monina C, NP  traZODone (DESYREL) 50 MG tablet Take 50 mg by mouth at bedtime.   Yes [provider]  trospium (SANCTURA) 20 MG tablet Take 20 mg by mouth 2 (two) times daily. 08/27/20  Yes [provider]  venlafaxine XR (EFFEXOR-XR) 150 MG 24 hr capsule Take by mouth. 12/08/20 02/18/23 Yes [provider]  ketoconazole (NIZORAL) 2 % cream Apply 1 Application topically daily.    [provider]    Current Outpatient Medications  Medication Sig Dispense Refill   ascorbic acid (VITAMIN C) 500 MG tablet Take 500 mg by mouth 2 (two) times daily.     atorvastatin (LIPITOR) 40 MG tablet Take 1 tablet (40 mg total) by mouth daily. 90 tablet 1   donepezil (ARICEPT) 10 MG tablet Take 1 tablet (10 mg total) by mouth at bedtime. 90 tablet 3   ferrous sulfate 325 (65 FE) MG tablet Take 1 tablet (325 mg total) by mouth daily. 90 tablet 1   lamoTRIgine (LAMICTAL) 200 MG tablet Take 1 tablet (200 mg total) by mouth daily. 90 tablet 1   levothyroxine (SYNTHROID) 75 MCG tablet Take 1 tablet (75 mcg total) by mouth daily. 90 tablet 3    lisinopril (ZESTRIL) 40 MG tablet Take 1 tablet (40 mg total) by mouth daily. 90 tablet 3   loratadine (CLARITIN) 10 MG tablet Take 10 mg by mouth daily.     melatonin 3 MG TABS tablet Take 3 mg by mouth at bedtime.     mirabegron ER (MYRBETRIQ) 50 MG TB24 tablet Take 50 mg by mouth daily.     nitrofurantoin (MACRODANTIN) 100 MG capsule Take 1 capsule (100 mg total) by mouth 2 (two) times daily. 14 capsule 0   Omega-3 Fatty Acids (FISH OIL) 1000 MG CAPS Take 1 capsule by mouth daily.     pregabalin (LYRICA) 50 MG capsule Take 1 capsule (50 mg total) by mouth 2 (two) times daily. 60 capsule 5   traZODone (DESYREL) 50 MG tablet Take 50 mg by mouth at bedtime.     trospium (SANCTURA) 20 MG tablet Take 20 mg by mouth 2 (two) times daily.     venlafaxine XR (EFFEXOR-XR) 150 MG 24 hr capsule Take by mouth.     ketoconazole (NIZORAL) 2 % cream Apply 1 Application topically daily.     Current Facility-Administered Medications  Medication Dose Route Frequency Provider Last Rate Last Admin   0.9 %  sodium chloride infusion  500 mL Intravenous Continuous Malayjah Otoole, Venia Minks, MD        Allergies as of 02/18/2023 - Review Complete 02/18/2023  Allergen Reaction Noted   Latex Rash 06/15/2021   Other Rash 02/09/2014   Wound dressing adhesive Rash 02/19/2014    Family History  Problem Relation Age of Onset   Colon cancer Neg Hx    Colon polyps Neg Hx    Esophageal cancer Neg Hx    Rectal cancer Neg Hx    Stomach cancer Neg Hx     Social History   Socioeconomic History   Marital status: Divorced    Spouse name: Not on file   Number of children: Not on file   Years of education: Not on file   Highest education level: Not on file  Occupational History   Not on file  Tobacco Use   Smoking status: Every Day    Packs/day: 0.50    Years: 51.00    Total pack years: 25.50    Types: Cigarettes    Passive exposure: Current   Smokeless tobacco: Never  Substance and Sexual Activity   Alcohol  use: Not Currently   Drug use: Not Currently   Sexual activity: Not Currently  Other Topics Concern   Not on file  Social History Narrative   Not on file   Social Determinants of Health   Financial Resource  Strain: Not on file  Food Insecurity: Not on file  Transportation Needs: Not on file  Physical Activity: Not on file  Stress: Not on file  Social Connections: Not on file  Intimate Partner Violence: Not on file    Review of Systems:  All other review of systems negative except as mentioned in the HPI.  Physical Exam: Vital signs in last 24 hours: Blood Pressure (Abnormal) 147/75   Pulse 63   Temperature 98.4 F (36.9 C)   Height '5\' 3"'$  (1.6 m)   Weight 165 lb (74.8 kg)   Oxygen Saturation 94%   Body Mass Index 29.23 kg/m  General:   Alert, NAD Lungs:  Clear .   Heart:  Regular rate and rhythm Abdomen:  Soft, nontender and nondistended. Neuro/Psych:  Alert and cooperative. Normal mood and affect. A and O x 3  Reviewed labs, radiology imaging, old records and pertinent past GI work up  Patient is appropriate for planned procedure(s) and anesthesia in an ambulatory setting   K. Denzil Magnuson , MD [phone]

## 2023-02-18 NOTE — Op Note (Addendum)
[facility] Patient Name: Shannon Hart Procedure Date: 02/18/2023 2:34 PM MRN: [id number] Endoscopist: Mauri Pole , MD, [id number] Age: 72 Referring MD:  Date of Birth: [birth year] Gender: Female Account #: [id number] Procedure:                Colonoscopy Indications:              Screening for colorectal malignant neoplasm Medicines:                Monitored Anesthesia Care Procedure:                Pre-Anesthesia Assessment:                           - Prior to the procedure, a History and Physical                            was performed, and patient medications and                            allergies were reviewed. The patient's tolerance of                            previous anesthesia was also reviewed. The risks                            and benefits of the procedure and the sedation                            options and risks were discussed with the patient.                            All questions were answered, and informed consent                            was obtained. Prior Anticoagulants: The patient has                            taken no anticoagulant or antiplatelet agents. ASA                            Grade Assessment: III - A patient with severe                            systemic disease. After reviewing the risks and                            benefits, the patient was deemed in satisfactory                            condition to undergo the procedure.                           After obtaining informed consent, the colonoscope  was passed under direct vision. Throughout the                            procedure, the patient's blood pressure, pulse, and                            oxygen saturations were monitored continuously. The                            PCF-HQ190L Colonoscope [id number] was introduced                            through the anus and advanced to the the cecum,                            identified by  appendiceal orifice and ileocecal                            valve. The colonoscopy was performed without                            difficulty. The patient tolerated the procedure                            well. The quality of the bowel preparation was                            adequate to identify polyps greater than 5 mm in                            size. The ileocecal valve, appendiceal orifice, and                            rectum were photographed. Scope In: 2:44:56 PM Scope Out: 3:17:36 PM Scope Withdrawal Time: 0 hours 21 minutes 14 seconds  Total Procedure Duration: 0 hours 32 minutes 40 seconds  Findings:                 The perianal and digital rectal examinations were                            normal.                           Four sessile polyps were found in the transverse                            colon and ascending colon. The polyps were 4 to 7                            mm in size. These polyps were removed with a cold                            snare. Resection and retrieval were complete.  Scattered medium-mouthed and small-mouthed                            diverticula were found in the sigmoid colon.                           Non-bleeding external and internal hemorrhoids were                            found during retroflexion. The hemorrhoids were                            medium-sized. Complications:            No immediate complications. Estimated Blood Loss:     Estimated blood loss was minimal. Impression:               - Four 4 to 7 mm polyps in the transverse colon and                            in the ascending colon, removed with a cold snare.                            Resected and retrieved.                           - Diverticulosis in the sigmoid colon.                           - Non-bleeding external and internal hemorrhoids.                           - The GI Genius (intelligent endoscopy module),                             computer-aided polyp detection system powered by AI                            was utilized to detect colorectal polyps through                            enhanced visualization during colonoscopy. Recommendation:           - Patient has a contact number available for                            emergencies. The signs and symptoms of potential                            delayed complications were discussed with the                            patient. Return to normal activities tomorrow.                            Written discharge instructions were provided to  the                            patient.                           - Resume previous diet.                           - Continue present medications.                           - Await pathology results.                           - Repeat colonoscopy in 1 year because the bowel                            preparation was suboptimal and for surveillance. Mauri Pole, MD 02/18/2023 3:24:18 PM This report has been signed electronically.

## 2023-02-18 NOTE — Progress Notes (Signed)
Pt's states no medical or surgical changes since previsit or office visit. 

## 2023-02-18 NOTE — Progress Notes (Unsigned)
To pacu, VSS. Report to [email or web address].tb 

## 2023-02-18 NOTE — Progress Notes (Unsigned)
Called to room to assist during endoscopic procedure.  Patient ID and intended procedure confirmed with present staff. Received instructions for my participation in the procedure from the performing physician.  

## 2023-02-19 ENCOUNTER — Telehealth: Payer: Self-pay | Admitting: *Deleted

## 2023-02-19 ENCOUNTER — Encounter: Payer: Self-pay | Admitting: Gastroenterology

## 2023-02-19 NOTE — Telephone Encounter (Signed)
Post procedure follow up phone call. No answer at number given.  Left message on voicemail.  

## 2023-02-22 ENCOUNTER — Encounter: Payer: Self-pay | Admitting: Gastroenterology

## 2023-02-25 NOTE — Procedures (Signed)
   HISTORY: 72 year old female presenting with slow worsening memory loss in the setting of polypharmacy, mood disorder  TECHNIQUE:  This is a routine 16 channel EEG recording with one channel devoted to a limited EKG recording.  It was performed during wakefulness, drowsiness and asleep.  Hyperventilation and photic stimulation were performed as activating procedures.  There are frequent bifrontal muscle and movement artifact noted.  Upon maximum arousal, posterior dominant waking rhythm consistent of low amplitude mild dysrhythmic alpha range activity. Activities are symmetric over the bilateral posterior derivations and attenuated with eye opening.  There are frequent bilateral frontal eye movement, muscle artifact  Hyperventilation produced mild/moderate buildup with higher amplitude and the slower activities noted.  Photic stimulation did not alter the tracing.  During EEG recording, patient was not able to achieve sleepiness,  During EEG recording, there was no epileptiform discharge noted.  EKG demonstrate normal sinus rhythm.  CONCLUSION: This is a  normal awake yet technically challenging EEG.  There is no electrodiagnostic evidence of epileptiform discharge.  Marcial Pacas, M.D. Ph.D.  [facility] [place], [place] [address] Phone: [phone] Fax:      [phone]

## 2023-02-26 ENCOUNTER — Ambulatory Visit
Admission: RE | Admit: 2023-02-26 | Discharge: 2023-02-26 | Disposition: A | Discharge status: Home / Self Care | Payer: Medicare Other | Source: Ambulatory Visit | Attending: Adult Health | Admitting: Adult Health

## 2023-02-26 DIAGNOSIS — G629 Polyneuropathy, unspecified: Secondary | ICD-10-CM

## 2023-03-01 NOTE — Progress Notes (Signed)
-    pls send this result to your [facility] provider.

## 2023-03-02 ENCOUNTER — Encounter: Payer: Self-pay | Admitting: Adult Health

## 2023-03-04 ENCOUNTER — Other Ambulatory Visit: Payer: Self-pay | Admitting: Adult Health

## 2023-03-04 MED ORDER — VENLAFAXINE HCL ER 150 MG PO CP24: 150.0000 mg | ORAL_CAPSULE | Freq: Every day | ORAL | 3 refills | Status: DC

## 2023-03-07 ENCOUNTER — Encounter: Payer: Self-pay | Admitting: Adult Health

## 2023-03-07 ENCOUNTER — Telehealth (INDEPENDENT_AMBULATORY_CARE_PROVIDER_SITE_OTHER): Payer: Medicare Other | Admitting: Adult Health

## 2023-03-07 DIAGNOSIS — Z0000 Encounter for general adult medical examination without abnormal findings: Secondary | ICD-10-CM | POA: Diagnosis not present

## 2023-03-07 DIAGNOSIS — Z1231 Encounter for screening mammogram for malignant neoplasm of breast: Secondary | ICD-10-CM | POA: Diagnosis not present

## 2023-03-07 NOTE — Progress Notes (Signed)
Provider Location:  [facility] Patient location:  Home  This is a video visit  Subjective:   Shannon Hart is a 72 y.o. female who presents for Medicare Annual (Subsequent) preventive examination.  Review of Systems          Objective:    There were no vitals filed for this visit. There is no height or weight on file to calculate BMI.     11/09/2022    9:45 AM 06/15/2021   12:30 PM  Advanced Directives  Does Patient Have a Medical Advance Directive? No Yes  Type of Advance Directive  [place]  Would patient like information on creating a medical advance directive? No - Patient declined     Current Medications (verified) Outpatient Encounter Medications as of 03/07/2023  Medication Sig   ascorbic acid (VITAMIN C) 500 MG tablet Take 500 mg by mouth 2 (two) times daily.   atorvastatin (LIPITOR) 40 MG tablet Take 1 tablet (40 mg total) by mouth daily.   donepezil (ARICEPT) 10 MG tablet Take 1 tablet (10 mg total) by mouth at bedtime.   ferrous sulfate 325 (65 FE) MG tablet Take 1 tablet (325 mg total) by mouth daily.   ketoconazole (NIZORAL) 2 % cream Apply 1 Application topically daily.   lamoTRIgine (LAMICTAL) 200 MG tablet Take 1 tablet (200 mg total) by mouth daily.   levothyroxine (SYNTHROID) 75 MCG tablet Take 1 tablet (75 mcg total) by mouth daily.   lisinopril (ZESTRIL) 40 MG tablet Take 1 tablet (40 mg total) by mouth daily.   loratadine (CLARITIN) 10 MG tablet Take 10 mg by mouth daily.   melatonin 3 MG TABS tablet Take 3 mg by mouth at bedtime.   mirabegron ER (MYRBETRIQ) 50 MG TB24 tablet Take 50 mg by mouth daily.   nitrofurantoin (MACRODANTIN) 100 MG capsule Take 1 capsule (100 mg total) by mouth 2 (two) times daily.   Omega-3 Fatty Acids (FISH OIL) 1000 MG CAPS Take 1 capsule by mouth daily.   pregabalin (LYRICA) 50 MG capsule Take 1 capsule (50 mg total) by mouth 2 (two) times daily.   traZODone (DESYREL) 50 MG tablet Take 50 mg by mouth at  bedtime.   trospium (SANCTURA) 20 MG tablet Take 20 mg by mouth 2 (two) times daily.   venlafaxine XR (EFFEXOR-XR) 150 MG 24 hr capsule Take 1 capsule (150 mg total) by mouth daily with breakfast.   No facility-administered encounter medications on file as of 03/07/2023.    Allergies (verified) Latex, Other, and Wound dressing adhesive   History: Past Medical History:  Diagnosis Date   Allergy    Alzheimer disease ([place])    Anxiety    Arthritis    Bipolar 1 disorder ([place])    Cancer ([place])    Cataract    CSF leak    Depression    Hyperlipidemia    Hypertension    Neuromuscular disorder ([facility])    Sleep apnea    TBI (traumatic brain injury) ([place])    Thyroid disease    TIA (transient ischemic attack)    Vitamin B6 deficiency    Vitamin D deficiency    Past Surgical History:  Procedure Laterality Date   ABDOMINAL HYSTERECTOMY     DG SHOULDER RIGHT COMPLETE  2023   RECTOVAGINAL FISTULA CLOSURE     ROTATOR CUFF REPAIR     THYROIDECTOMY  2004   Family History  Problem Relation Age of Onset   Colon cancer Neg Hx  Colon polyps Neg Hx    Esophageal cancer Neg Hx    Rectal cancer Neg Hx    Stomach cancer Neg Hx    Social History   Socioeconomic History   Marital status: Divorced    Spouse name: Not on file   Number of children: Not on file   Years of education: Not on file   Highest education level: Not on file  Occupational History   Not on file  Tobacco Use   Smoking status: Every Day    Packs/day: 0.50    Years: 51.00    Additional pack years: 0.00    Total pack years: 25.50    Types: Cigarettes    Passive exposure: Current   Smokeless tobacco: Never  Substance and Sexual Activity   Alcohol use: Not Currently   Drug use: Not Currently   Sexual activity: Not Currently  Other Topics Concern   Not on file  Social History Narrative   Not on file   Social Determinants of Health   Financial Resource Strain: Not on file  Food Insecurity: Not on file   Transportation Needs: Not on file  Physical Activity: Not on file  Stress: Not on file  Social Connections: Not on file    Tobacco Counseling Ready to quit: Not Answered Counseling given: Not Answered   Clinical Intake:                 Diabetic? No         Activities of Daily Living     No data to display          Patient Care Team: Medina-Vargas, Senaida Lange, NP as PCP - General (Internal Medicine)  Indicate any recent Medical Services you may have received from other than [facility] providers in the past year (date may be approximate).     Assessment:   This is a routine wellness examination for Shannon Hart.  Hearing/Vision screen No results found.  Dietary issues and exercise activities discussed:     Goals Addressed   None    Depression Screen    03/07/2023    3:19 PM  PHQ 2/9 Scores  PHQ - 2 Score 0    Fall Risk    03/07/2023    3:20 PM 12/17/2022    3:32 PM 11/09/2022    9:45 AM  [place] in the past year? 0 0 0  Number falls in past yr: 0 0 0  Injury with Fall? 0 0 0  Risk for fall due to : No Fall Risks No Fall Risks No Fall Risks  Follow up Falls evaluation completed Falls evaluation completed Falls evaluation completed    [place]:  Any stairs in or around the home? No  If so, are there any without handrails?  N/A Home free of loose throw rugs in walkways, pet beds, electrical cords, etc? Yes  Adequate lighting in your home to reduce risk of falls? Yes   ASSISTIVE DEVICES UTILIZED TO PREVENT FALLS:  Life alert? No  Use of a cane, walker or w/c? No  Grab bars in the bathroom? Yes  Shower chair or bench in shower? Yes  Elevated toilet seat or a handicapped toilet? No   TIMED UP AND GO:  Was the test performed? No .  Length of time to ambulate 10 feet: N/A sec.   Gait steady and fast without use of assistive device  Cognitive Function:  01/17/2023    4:00 PM  Montreal Cognitive  Assessment   Visuospatial/ Executive (0/5) 5  Naming (0/3) 3  Attention: Read list of digits (0/2) 2  Attention: Read list of letters (0/1) 1  Attention: Serial 7 subtraction starting at 100 (0/3) 1  Language: Repeat phrase (0/2) 1  Language : Fluency (0/1) 0  Abstraction (0/2) 2  Delayed Recall (0/5) 2  Orientation (0/6) 6  Total 23      03/07/2023    3:21 PM  6CIT Screen  What Year? 0 points  What month? 0 points  What time? 0 points  Count back from 20 0 points  Months in reverse 0 points  Repeat phrase 0 points  Total Score 0 points    Immunizations Immunization History  Administered Date(s) Administered   DTaP / Hep B / IPV 03/14/2022   Fluad Quad(high Dose 65+) 12/17/2022   Influenza, High Dose Seasonal PF 10/06/2019   Influenza, Seasonal, Injecte, Preservative Fre 08/25/2008, 11/09/2009, 10/20/2010, 01/03/2012, 10/03/2012, 10/13/2013   Influenza,inj,Quad PF,6+ Mos 08/24/2016, 09/13/2020, 09/28/2021   Influenza,trivalent, recombinat, inj, PF 08/30/2014   Influenza-Unspecified 08/24/2016, 09/26/2018, 10/06/2019, 09/13/2020, 12/17/2022   PFIZER(Purple Top)SARS-COV-2 Vaccination 01/15/2020, 02/05/2020, 09/13/2020   Pfizer Covid-19 Vaccine Bivalent Booster 53yrs & up 11/26/2021   Pneumococcal Conjugate-13 02/18/2015, 06/10/2020   Pneumococcal Polysaccharide-23 03/03/2008, 11/14/2021   Tdap 03/03/2008, 03/14/2022   Zoster Recombinat (Shingrix) 03/14/2022, 05/22/2022   Zoster, Live 09/01/2014    TDAP status: Up to date  Flu Vaccine status: Up to date  Pneumococcal vaccine status: Up to date  Covid-19 vaccine status: Completed vaccines  Qualifies for Shingles Vaccine? Yes   Zostavax completed Yes   Shingrix Completed?: Yes  Screening Tests Health Maintenance  Topic Date Due   Hepatitis C Screening  Never done   MAMMOGRAM  Never done   DEXA SCAN  Never done   COVID-19 Vaccine (5 - 2023-24 season) 08/10/2022   Lung Cancer Screening  01/12/2024    COLONOSCOPY (Pts 45-25yrs Insurance coverage will need to be confirmed)  02/18/2024   Medicare Annual Wellness (AWV)  03/06/2024   DTaP/Tdap/Td (4 - Td or Tdap) 03/14/2032   Pneumonia Vaccine 40+ Years old  Completed   INFLUENZA VACCINE  Completed   Zoster Vaccines- Shingrix  Completed   HPV VACCINES  Aged Out    Health Maintenance  Health Maintenance Due  Topic Date Due   Hepatitis C Screening  Never done   MAMMOGRAM  Never done   DEXA SCAN  Never done   COVID-19 Vaccine (5 - 2023-24 season) 08/10/2022    Colorectal cancer screening: Type of screening: Colonoscopy. Completed 02/18/23. Repeat every 1 years bowel prep not good  Mammogram status: Ordered will reschedule. Pt provided with contact info and advised to call to schedule appt.   Bone Density status: Ordered 11/10/23. Pt provided with contact info and advised to call to schedule appt.  Lung Cancer Screening: (Low Dose CT Chest recommended if Age 2-80 years, 30 pack-year currently smoking OR have quit w/in 15years.) does qualify.   Lung Cancer Screening Referral: No  Additional Screening:  Hepatitis C Screening: does not qualify; Completed 01/11/23  Vision Screening: Recommended annual ophthalmology exams for early detection of glaucoma and other disorders of the eye. Is the patient up to date with their annual eye exam?  Yes  Who is the provider or what is the name of the office in which the patient attends annual eye exams? In [place], [place], Dr. Jacolyn Reedy If pt is not  established with a provider, would they like to be referred to a provider to establish care? No .   Dental Screening: Recommended annual dental exams for proper oral hygiene  Community Resource Referral / Chronic Care Management: CRR required this visit?  No   CCM required this visit?  No      Plan:     I have personally reviewed and noted the following in the patient's chart:   Medical and social history Use of alcohol, tobacco or illicit  drugs  Current medications and supplements including opioid prescriptions. Patient is not currently taking opioid prescriptions. Functional ability and status Nutritional status Physical activity Advanced directives List of other physicians Hospitalizations, surgeries, and ER visits in previous 12 months Vitals Screenings to include cognitive, depression, and falls Referrals and appointments  In addition, I have reviewed and discussed with patient certain preventive protocols, quality metrics, and best practice recommendations. A written personalized care plan for preventive services as well as general preventive health recommendations were provided to patient.     Shannon Hart Medina-Vargas, NP   03/07/2023   Nurse Notes:  Needs to be done annually.

## 2023-03-13 ENCOUNTER — Ambulatory Visit (HOSPITAL_COMMUNITY): Payer: Medicare Other | Admitting: Psychiatry

## 2023-03-18 ENCOUNTER — Ambulatory Visit: Payer: Medicare Other | Admitting: Adult Health

## 2023-03-27 ENCOUNTER — Ambulatory Visit
Admission: RE | Admit: 2023-03-27 | Discharge: 2023-03-27 | Disposition: A | Discharge status: Home / Self Care | Payer: Medicare Other | Source: Ambulatory Visit | Attending: Adult Health | Admitting: Adult Health

## 2023-03-28 ENCOUNTER — Ambulatory Visit (INDEPENDENT_AMBULATORY_CARE_PROVIDER_SITE_OTHER): Payer: Medicare Other | Admitting: Adult Health

## 2023-03-28 ENCOUNTER — Encounter: Payer: Self-pay | Admitting: Adult Health

## 2023-03-28 VITALS — BP 128/70 | HR 79 | Temp 96.8°F | Resp 18 | Ht 63.5 in | Wt 162.0 lb

## 2023-03-28 DIAGNOSIS — I10 Essential (primary) hypertension: Secondary | ICD-10-CM | POA: Diagnosis not present

## 2023-03-28 DIAGNOSIS — E890 Postprocedural hypothyroidism: Secondary | ICD-10-CM

## 2023-03-28 DIAGNOSIS — G2581 Restless legs syndrome: Secondary | ICD-10-CM

## 2023-03-28 DIAGNOSIS — N3281 Overactive bladder: Secondary | ICD-10-CM

## 2023-03-28 DIAGNOSIS — F5101 Primary insomnia: Secondary | ICD-10-CM

## 2023-03-28 DIAGNOSIS — Z1159 Encounter for screening for other viral diseases: Secondary | ICD-10-CM

## 2023-03-28 DIAGNOSIS — F319 Bipolar disorder, unspecified: Secondary | ICD-10-CM

## 2023-03-28 DIAGNOSIS — F0390 Unspecified dementia without behavioral disturbance: Secondary | ICD-10-CM

## 2023-03-28 DIAGNOSIS — R69 Illness, unspecified: Secondary | ICD-10-CM

## 2023-03-28 LAB — CBC WITH DIFFERENTIAL/PLATELET
Absolute Monocytes: 556 cells/uL (ref 200–950)
Basophils Absolute: 51 cells/uL (ref 0–200)
Basophils Relative: 0.5 %
Eosinophils Relative: 1.1 %
RBC: 3.96 10*6/uL (ref 3.80–5.10)
RDW: 13.1 % (ref 11.0–15.0)

## 2023-03-28 NOTE — Progress Notes (Signed)
[facility] clinic  Provider:  Kenard Gower DNP  Code Status:  Full Code  Goals of Care:     11/09/2022    9:45 AM  Advanced Directives  Does Patient Have a Medical Advance Directive? No  Would patient like information on creating a medical advance directive? No - Patient declined     Chief Complaint  Patient presents with   Follow-up    Three Month Follow Up   Quality Metric Gaps    Needs to discuss Hepatitis C Screening and Covid vaccine    HPI: Patient is a 72 y.o. female seen today for medical management of chronic issues.   Postoperative hypothyroidism -  last tsh 0.14, low, takes Levothyroxine  OAB (overactive bladder) -  takes Trospium and Myrbetriq, was seen by Dr. Alfredo Martinez, urologist, on 01/14/23, for cystoscopy on 05/13/23  Primary hypertension -  BP 128/70, takes lisinopril  Bipolar 1 disorder -  denies agitation nor hallucinations, takes  Venlafaxine and Lamotrigine  Primary insomnia -  gets up every 3 hours at night to urinate, blames restless leg syndrome, takes Trazodone and Melatonin  Restless leg syndrome -  gets up every 3 hours at night to urinate, blames restless leg syndrome, takes Lyrica  Dementia without behavioral disturbance, psychotic disturbance, mood disturbance, or anxiety, unspecified dementia severity, unspecified dementia type -  takes Donepezil    Past Medical History:  Diagnosis Date   Allergy    Alzheimer disease    Anxiety    Arthritis    Bipolar 1 disorder    Cancer    Cataract    CSF leak    Depression    Hyperlipidemia    Hypertension    Neuromuscular disorder    Sleep apnea    TBI (traumatic brain injury)    Thyroid disease    TIA (transient ischemic attack)    Vitamin B6 deficiency    Vitamin D deficiency     Past Surgical History:  Procedure Laterality Date   ABDOMINAL HYSTERECTOMY     DG SHOULDER RIGHT COMPLETE  2023   RECTOVAGINAL FISTULA CLOSURE     ROTATOR CUFF REPAIR     THYROIDECTOMY  2004     Allergies  Allergen Reactions   Latex Rash   Other Rash    Tape made her have blisters   Wound Dressing Adhesive Rash    Outpatient Encounter Medications as of 03/28/2023  Medication Sig   ascorbic acid (VITAMIN C) 500 MG tablet Take 500 mg by mouth 2 (two) times daily.   atorvastatin (LIPITOR) 40 MG tablet Take 1 tablet (40 mg total) by mouth daily.   donepezil (ARICEPT) 10 MG tablet Take 1 tablet (10 mg total) by mouth at bedtime.   ferrous sulfate 325 (65 FE) MG tablet Take 1 tablet (325 mg total) by mouth daily.   ketoconazole (NIZORAL) 2 % cream Apply 1 Application topically daily.   lamoTRIgine (LAMICTAL) 200 MG tablet Take 1 tablet (200 mg total) by mouth daily.   levothyroxine (SYNTHROID) 75 MCG tablet Take 1 tablet (75 mcg total) by mouth daily.   lisinopril (ZESTRIL) 40 MG tablet Take 1 tablet (40 mg total) by mouth daily.   loratadine (CLARITIN) 10 MG tablet Take 10 mg by mouth daily.   melatonin 3 MG TABS tablet Take 3 mg by mouth at bedtime.   mirabegron ER (MYRBETRIQ) 50 MG TB24 tablet Take 50 mg by mouth daily.   nitrofurantoin (MACRODANTIN) 100 MG capsule Take 1 capsule (100 mg  total) by mouth 2 (two) times daily.   Omega-3 Fatty Acids (FISH OIL) 1000 MG CAPS Take 1 capsule by mouth daily.   pregabalin (LYRICA) 50 MG capsule Take 1 capsule (50 mg total) by mouth 2 (two) times daily.   traZODone (DESYREL) 50 MG tablet Take 50 mg by mouth at bedtime.   trospium (SANCTURA) 20 MG tablet Take 20 mg by mouth 2 (two) times daily.   venlafaxine XR (EFFEXOR-XR) 150 MG 24 hr capsule Take 1 capsule (150 mg total) by mouth daily with breakfast.   No facility-administered encounter medications on file as of 03/28/2023.    Review of Systems:  Review of Systems  Constitutional:  Negative for appetite change, chills, fatigue and fever.  HENT:  Negative for congestion, hearing loss, rhinorrhea and sore throat.   Eyes: Negative.   Respiratory:  Negative for cough, shortness of  breath and wheezing.   Cardiovascular:  Negative for chest pain, palpitations and leg swelling.  Gastrointestinal:  Negative for abdominal pain, constipation, diarrhea, nausea and vomiting.  Genitourinary:  Negative for dysuria.  Musculoskeletal:  Negative for arthralgias, back pain and myalgias.  Skin:  Negative for color change, rash and wound.  Neurological:  Negative for dizziness, weakness and headaches.  Psychiatric/Behavioral:  Negative for behavioral problems. The patient is not nervous/anxious.     Health Maintenance  Topic Date Due   Hepatitis C Screening  Never done   MAMMOGRAM  Never done   DEXA SCAN  Never done   COVID-19 Vaccine (6 - 2023-24 season) 04/13/2023 (Originally 02/11/2023)   INFLUENZA VACCINE  07/11/2023   Lung Cancer Screening  01/12/2024   COLONOSCOPY (Pts 45-72yrs Insurance coverage will need to be confirmed)  02/18/2024   Medicare Annual Wellness (AWV)  03/06/2024   DTaP/Tdap/Td (4 - Td or Tdap) 03/14/2032   Pneumonia Vaccine 36+ Years old  Completed   Zoster Vaccines- Shingrix  Completed   HPV VACCINES  Aged Out    Physical Exam: Vitals:   03/28/23 1513  BP: 128/70  Pulse: 79  Resp: 18  Temp: (!) 96.8 F (36 C)  SpO2: 98%  Weight: 162 lb (73.5 kg)  Height: 5' 3.5" (1.613 m)   Body mass index is 28.25 kg/m. Physical Exam Constitutional:      Appearance: Normal appearance.  HENT:     Head: Normocephalic and atraumatic.     Nose: Nose normal.     Mouth/Throat:     Mouth: Mucous membranes are moist.  Eyes:     Conjunctiva/sclera: Conjunctivae normal.  Cardiovascular:     Rate and Rhythm: Normal rate and regular rhythm.  Pulmonary:     Effort: Pulmonary effort is normal.     Breath sounds: Normal breath sounds.  Abdominal:     General: Bowel sounds are normal.     Palpations: Abdomen is soft.  Musculoskeletal:        General: Normal range of motion.     Cervical back: Normal range of motion.  Skin:    General: Skin is warm and dry.   Neurological:     General: No focal deficit present.     Mental Status: She is alert and oriented to person, place, and time.  Psychiatric:        Mood and Affect: Mood normal.        Behavior: Behavior normal.        Thought Content: Thought content normal.        Judgment: Judgment normal.  Labs reviewed: Basic Metabolic Panel: Recent Labs    11/09/22 1418  NA 143  K 3.9  CL 104  CO2 29  GLUCOSE 87  BUN 14  CREATININE 0.92  CALCIUM 9.2  TSH 0.14*   Liver Function Tests: Recent Labs    11/09/22 1418  AST 11  ALT 8  BILITOT 0.6  PROT 6.8   No results for input(s): "LIPASE", "AMYLASE" in the last 8760 hours. No results for input(s): "AMMONIA" in the last 8760 hours. CBC: Recent Labs    11/09/22 1418  WBC 7.0  NEUTROABS 4,473  HGB 11.7  HCT 33.9*  MCV 92.6  PLT 211   Lipid Panel: No results for input(s): "CHOL", "HDL", "LDLCALC", "TRIG", "CHOLHDL", "LDLDIRECT" in the last 8760 hours. No results found for: "HGBA1C"  Procedures since last visit: No results found.  Assessment/Plan . 1. Postoperative hypothyroidism -  continue Levothyroxine - TSH  2. OAB (overactive bladder) -  continue Myrbetriq and Trospium  3. Primary hypertension -  BP 128/70, stable -  continue Lisinopril  4. Bipolar 1 disorder -  mood is stable -  continue Venlafaxine and Lamotrigine  5. Primary insomnia -  continue Trazodone and Melatonin  6. Encounter for hepatitis C screening test for low risk patient - Hep C Antibody  7. Restless leg syndrome -  continue Lyrica  8. Taking multiple medications for chronic disease - COMPLETE METABOLIC PANEL WITH GFR - CBC With Differential/Platelet  9. Dementia without behavioral disturbance, psychotic disturbance, mood disturbance, or anxiety, unspecified dementia severity, unspecified dementia type -  continue Donepezil -  fall precautions     Labs/tests ordered:  cbc, cmp, tsh and hep c  Next appt:  Visit date not  found

## 2023-03-29 LAB — COMPLETE METABOLIC PANEL WITH GFR
AG Ratio: 2 (calc) (ref 1.0–2.5)
ALT: 8 U/L (ref 6–29)
AST: 11 U/L (ref 10–35)
Albumin: 4.6 g/dL (ref 3.6–5.1)
Alkaline phosphatase (APISO): 154 U/L — ABNORMAL HIGH (ref 37–153)
BUN: 17 mg/dL (ref 7–25)
CO2: 28 mmol/L (ref 20–32)
Calcium: 8.9 mg/dL (ref 8.6–10.4)
Chloride: 105 mmol/L (ref 98–110)
Creat: 0.96 mg/dL (ref 0.60–1.00)
Globulin: 2.3 g/dL (calc) (ref 1.9–3.7)
Glucose, Bld: 84 mg/dL (ref 65–99)
Potassium: 4 mmol/L (ref 3.5–5.3)
Sodium: 142 mmol/L (ref 135–146)
Total Bilirubin: 0.4 mg/dL (ref 0.2–1.2)
Total Protein: 6.9 g/dL (ref 6.1–8.1)
eGFR: 64 mL/min/{1.73_m2} (ref >=60)

## 2023-03-29 LAB — CBC WITH DIFFERENTIAL/PLATELET
Eosinophils Absolute: 111 cells/uL (ref 15–500)
HCT: 36.3 % (ref 35.0–45.0)
Hemoglobin: 12.4 g/dL (ref 11.7–15.5)
Lymphs Abs: 2071 cells/uL (ref 850–3900)
MCH: 31.3 pg (ref 27.0–33.0)
MCHC: 34.2 g/dL (ref 32.0–36.0)
MCV: 91.7 fL (ref 80.0–100.0)
MPV: 9.9 fL (ref 7.5–12.5)
Monocytes Relative: 5.5 %
Neutro Abs: 7312 cells/uL (ref 1500–7800)
Neutrophils Relative %: 72.4 %
Platelets: 183 10*3/uL (ref 140–400)
Total Lymphocyte: 20.5 %
WBC: 10.1 10*3/uL (ref 3.8–10.8)

## 2023-03-29 LAB — TSH: TSH: 0.42 mIU/L (ref 0.40–4.50)

## 2023-03-29 LAB — HEPATITIS C ANTIBODY: Hepatitis C Ab: NONREACTIVE

## 2023-03-29 NOTE — Progress Notes (Signed)
-    alk phos 154, down from 178 (4 months ago) -  tsh 0.42, normal level -  CBC normal, no infection, not anemic

## 2023-04-02 NOTE — Progress Notes (Signed)
Mammogram is negative for malignancy.

## 2023-04-10 ENCOUNTER — Ambulatory Visit (HOSPITAL_COMMUNITY): Payer: Medicare Other | Admitting: Psychiatry

## 2023-04-10 DIAGNOSIS — F319 Bipolar disorder, unspecified: Secondary | ICD-10-CM | POA: Diagnosis not present

## 2023-04-10 DIAGNOSIS — F5101 Primary insomnia: Secondary | ICD-10-CM

## 2023-04-10 MED ORDER — LAMOTRIGINE 200 MG PO TABS: 200.0000 mg | ORAL_TABLET | Freq: Every day | ORAL | 1 refills | Status: DC

## 2023-04-10 MED ORDER — TRAZODONE HCL 50 MG PO TABS: 50.0000 mg | ORAL_TABLET | Freq: Every day | ORAL | 1 refills | Status: DC
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
## 2023-04-24 ENCOUNTER — Other Ambulatory Visit: Payer: Self-pay | Admitting: Adult Health

## 2023-04-24 DIAGNOSIS — M797 Fibromyalgia: Secondary | ICD-10-CM

## 2023-04-24 NOTE — Telephone Encounter (Signed)
[facility]ill request received [facility] for pregabalin 50 mg.No treatment agreement on file  Medication pended and sent to Kenard Gower, NP

## 2023-04-24 NOTE — Telephone Encounter (Signed)
Pharmacy requested refill.  Pended Rx and sent to [facility] for approval.

## 2023-04-30 ENCOUNTER — Ambulatory Visit
Admission: RE | Admit: 2023-04-30 | Discharge: 2023-04-30 | Disposition: A | Discharge status: Home / Self Care | Payer: Medicare Other | Source: Ambulatory Visit | Attending: Adult Health | Admitting: Adult Health

## 2023-04-30 DIAGNOSIS — Z7689 Persons encountering health services in other specified circumstances: Secondary | ICD-10-CM

## 2023-04-30 DIAGNOSIS — Z13820 Encounter for screening for osteoporosis: Secondary | ICD-10-CM

## 2023-04-30 NOTE — Progress Notes (Signed)
-    bone density showed osteoporosis, can discuss possible medications to build bones on next visit

## 2023-05-01 ENCOUNTER — Ambulatory Visit: Payer: Medicare Other

## 2023-05-02 ENCOUNTER — Encounter: Payer: Self-pay | Admitting: Adult Health

## 2023-05-02 ENCOUNTER — Ambulatory Visit (INDEPENDENT_AMBULATORY_CARE_PROVIDER_SITE_OTHER): Payer: Medicare Other | Admitting: Adult Health

## 2023-05-02 ENCOUNTER — Telehealth: Payer: Self-pay

## 2023-05-02 VITALS — BP 125/78 | HR 72 | Temp 97.1°F | Resp 18 | Ht 63.5 in | Wt 156.4 lb

## 2023-05-02 DIAGNOSIS — J069 Acute upper respiratory infection, unspecified: Secondary | ICD-10-CM

## 2023-05-02 DIAGNOSIS — R0989 Other specified symptoms and signs involving the circulatory and respiratory systems: Secondary | ICD-10-CM | POA: Diagnosis not present

## 2023-05-02 DIAGNOSIS — M810 Age-related osteoporosis without current pathological fracture: Secondary | ICD-10-CM | POA: Diagnosis not present

## 2023-05-02 DIAGNOSIS — U071 COVID-19: Secondary | ICD-10-CM

## 2023-05-02 DIAGNOSIS — Z20822 Contact with and (suspected) exposure to covid-19: Secondary | ICD-10-CM

## 2023-05-02 LAB — POC COVID19 BINAXNOW: SARS Coronavirus 2 Ag: POSITIVE — AB

## 2023-05-02 MED ORDER — DENOSUMAB 60 MG/ML ~~LOC~~ SOSY
60.0000 mg | PREFILLED_SYRINGE | SUBCUTANEOUS | 0 refills | Status: DC
Start: 2023-05-02 — End: 2023-07-09

## 2023-05-02 MED ORDER — COVID-19 AT-HOME TEST VI KIT
1.0000 | PACK | 1 refills | Status: DC
Start: 2023-05-02 — End: 2023-05-08

## 2023-05-02 MED ORDER — NIRMATRELVIR/RITONAVIR (PAXLOVID)TABLET
3.0000 | ORAL_TABLET | Freq: Two times a day (BID) | ORAL | 0 refills | Status: AC
Start: 2023-05-02 — End: 2023-05-07

## 2023-05-02 NOTE — Progress Notes (Signed)
[facility] clinic  Provider:  Kenard Gower NP  Code Status:  Full Code  Goals of Care:     11/09/2022    9:45 AM  Advanced Directives  Does Patient Have a Medical Advance Directive? No  Would patient like information on creating a medical advance directive? No - Patient declined     Chief Complaint  Patient presents with   Acute Visit    Go over bone density result start medication      HPI: Patient is a 72 y.o. female seen today for an acute visit for follow up on her bone density result. T-score on her left femur neck  bone density was -2.5 which is ranging in osteoporosis.  Her daughter has COVID-19. Patient currently has runny nose and occasional cough. She denies SOB, wheezing nor body aches.  COVID-19 test today was positive.   Past Medical History:  Diagnosis Date   Allergy    Alzheimer disease (HCC)    Anxiety    Arthritis    Bipolar 1 disorder (HCC)    Cancer (HCC)    Cataract    CSF leak    Depression    Hyperlipidemia    Hypertension    Neuromuscular disorder (HCC)    Sleep apnea    TBI (traumatic brain injury) (HCC)    Thyroid disease    TIA (transient ischemic attack)    Vitamin B6 deficiency    Vitamin D deficiency     Past Surgical History:  Procedure Laterality Date   ABDOMINAL HYSTERECTOMY     DG SHOULDER RIGHT COMPLETE  2023   RECTOVAGINAL FISTULA CLOSURE     ROTATOR CUFF REPAIR     THYROIDECTOMY  2004    Allergies  Allergen Reactions   Latex Rash   Other Rash    Tape made her have blisters   Wound Dressing Adhesive Rash    Outpatient Encounter Medications as of 05/02/2023  Medication Sig   ascorbic acid (VITAMIN C) 500 MG tablet Take 500 mg by mouth 2 (two) times daily.   atorvastatin (LIPITOR) 40 MG tablet Take 1 tablet (40 mg total) by mouth daily.   COVID-19 At-Home Test KIT 1 Device by Other route as directed.   donepezil (ARICEPT) 10 MG tablet Take 1 tablet (10 mg total) by mouth at bedtime.   ferrous sulfate 325  (65 FE) MG tablet Take 1 tablet (325 mg total) by mouth daily.   ketoconazole (NIZORAL) 2 % cream Apply 1 Application topically daily.   lamoTRIgine (LAMICTAL) 200 MG tablet Take 1 tablet (200 mg total) by mouth daily.   levothyroxine (SYNTHROID) 75 MCG tablet Take 1 tablet (75 mcg total) by mouth daily.   lisinopril (ZESTRIL) 40 MG tablet Take 1 tablet (40 mg total) by mouth daily.   loratadine (CLARITIN) 10 MG tablet Take 10 mg by mouth daily.   melatonin 3 MG TABS tablet Take 3 mg by mouth at bedtime.   mirabegron ER (MYRBETRIQ) 50 MG TB24 tablet Take 50 mg by mouth daily.   nitrofurantoin (MACRODANTIN) 100 MG capsule Take 1 capsule (100 mg total) by mouth 2 (two) times daily.   Omega-3 Fatty Acids (FISH OIL) 1000 MG CAPS Take 1 capsule by mouth daily.   pregabalin (LYRICA) 50 MG capsule Take 1 capsule by mouth twice daily.   traZODone (DESYREL) 50 MG tablet Take 1 tablet (50 mg total) by mouth at bedtime.   trospium (SANCTURA) 20 MG tablet Take 20 mg by mouth 2 (two) times  daily.   venlafaxine XR (EFFEXOR-XR) 150 MG 24 hr capsule Take 1 capsule (150 mg total) by mouth daily with breakfast.   No facility-administered encounter medications on file as of 05/02/2023.    Review of Systems:  Review of Systems  Constitutional:  Negative for appetite change, chills, fatigue and fever.  HENT:  Positive for rhinorrhea. Negative for congestion, hearing loss and sore throat.   Eyes: Negative.   Respiratory:  Positive for choking. Negative for cough, shortness of breath and wheezing.   Cardiovascular:  Negative for chest pain, palpitations and leg swelling.  Gastrointestinal:  Negative for abdominal pain, constipation, diarrhea, nausea and vomiting.  Genitourinary:  Negative for dysuria.  Musculoskeletal:  Negative for arthralgias, back pain and myalgias.  Skin:  Negative for color change, rash and wound.  Neurological:  Negative for dizziness, weakness and headaches.  Psychiatric/Behavioral:   Negative for behavioral problems. The patient is not nervous/anxious.     Health Maintenance  Topic Date Due   COVID-19 Vaccine (6 - 2023-24 season) 02/11/2023   INFLUENZA VACCINE  07/11/2023   Lung Cancer Screening  01/12/2024   Colonoscopy  02/18/2024   Medicare Annual Wellness (AWV)  03/06/2024   MAMMOGRAM  03/26/2025   DTaP/Tdap/Td (4 - Td or Tdap) 03/14/2032   Pneumonia Vaccine 47+ Years old  Completed   DEXA SCAN  Completed   Hepatitis C Screening  Completed   Zoster Vaccines- Shingrix  Completed   HPV VACCINES  Aged Out    Physical Exam: Vitals:   05/02/23 1529  BP: 125/78  Pulse: 72  Resp: 18  Temp: (!) 97.1 F (36.2 C)  SpO2: 95%  Weight: 156 lb 6.4 oz (70.9 kg)  Height: 5' 3.5" (1.613 m)   Body mass index is 27.27 kg/m. Physical Exam Constitutional:      Appearance: Normal appearance.  HENT:     Head: Normocephalic and atraumatic.     Nose: Nose normal.     Mouth/Throat:     Mouth: Mucous membranes are moist.  Eyes:     Conjunctiva/sclera: Conjunctivae normal.  Cardiovascular:     Rate and Rhythm: Normal rate and regular rhythm.  Pulmonary:     Effort: Pulmonary effort is normal.     Breath sounds: Normal breath sounds.  Abdominal:     General: Bowel sounds are normal.     Palpations: Abdomen is soft.  Musculoskeletal:        General: Normal range of motion.     Cervical back: Normal range of motion.  Skin:    General: Skin is warm and dry.  Neurological:     General: No focal deficit present.     Mental Status: She is alert and oriented to person, place, and time.  Psychiatric:        Mood and Affect: Mood normal.        Behavior: Behavior normal.        Thought Content: Thought content normal.        Judgment: Judgment normal.     Labs reviewed: Basic Metabolic Panel: Recent Labs    11/09/22 1418 03/28/23 1541  NA 143 142  K 3.9 4.0  CL 104 105  CO2 29 28  GLUCOSE 87 84  BUN 14 17  CREATININE 0.92 0.96  CALCIUM 9.2 8.9  TSH  0.14* 0.42   Liver Function Tests: Recent Labs    11/09/22 1418 03/28/23 1541  AST 11 11  ALT 8 8  BILITOT 0.6 0.4  PROT  6.8 6.9   No results for input(s): "LIPASE", "AMYLASE" in the last 8760 hours. No results for input(s): "AMMONIA" in the last 8760 hours. CBC: Recent Labs    11/09/22 1418 03/28/23 1541  WBC 7.0 10.1  NEUTROABS 4,473 7,312  HGB 11.7 12.4  HCT 33.9* 36.3  MCV 92.6 91.7  PLT 211 183   Lipid Panel: No results for input(s): "CHOL", "HDL", "LDLCALC", "TRIG", "CHOLHDL", "LDLDIRECT" in the last 8760 hours. No results found for: "HGBA1C"  Procedures since last visit: DG Bone Density  Result Date: 04/30/2023 EXAM: DUAL X-RAY ABSORPTIOMETRY (DXA) FOR BONE MINERAL DENSITY IMPRESSION: Referring Physician:  Margit Banda MEDINA-VARGAS Your patient completed a bone mineral density test using GE Lunar iDXA system (analysis version: 16). Technologist:    lmn PATIENT: Name: Shannon, Hart Patient ID:  [id number] Birth Date: July 07, 1951 Height:     62.5 in. Sex:         Female Measured:   04/30/2023 Weight:     157.4 lbs. Indications: Advanced Age, Caucasian, Effexor, Estrogen Deficient, Hypothyroid, Hysterectomy, Lamictal, Lyrica, Postmenopausal, Trazodone Fractures: NONE Treatments: Calcium (E943.0), Vitamin D (E933.5) ASSESSMENT: The BMD measured at Femur Neck Left is 0.690 g/cm2 with a T-score of -2.5. This patient is considered osteoporotic according to World Health Organization [facility]) criteria. The quality of the exam is good. Site Region Measured Date Measured Age YA BMD Significant CHANGE T-score AP Spine  L1-L4      5/30/2023    72.0         -1.7    0.978 g/cm2 DualFemur Neck Left  04/30/2023    72.0         -2.5    0.690 g/cm2 DualFemur Total Mean 5/30/2023    72.0         -2.1    0.747 g/cm2 World Health Organization [facility]) criteria for post-menopausal, Caucasian Women: Normal       T-score at or above -1 SD Osteopenia   T-score between -1 and -2.5 SD Osteoporosis T-score at or  below -2.5 SD RECOMMENDATION: 1. All patients should optimize calcium and vitamin D intake. 2. Consider FDA-approved medical therapies in postmenopausal women and men aged 33 years and older, based on the following: a. A hip or vertebral (clinical or morphometric) fracture. b. T-score = -2.5 at the femoral neck or spine after appropriate evaluation to exclude secondary causes. c. Low bone mass (T-score between -1.0 and -2.5 at the femoral neck or spine) and a 10-year probability of a hip fracture = 3% or a 10-year probability of a major osteoporosis-related fracture = 20% based on the [place]-adapted WHO algorithm. d. Clinician judgment and/or patient preferences may indicate treatment for people with 10-year fracture probabilities above or below these levels. FOLLOW-UP: Patients with diagnosis of osteoporosis or at high risk for fracture should have regular bone mineral density tests. Patients eligible for Medicare are allowed routine testing every 2 years. The testing frequency can be increased to one year for patients who have rapidly progressing disease, are receiving or discontinuing medical therapy to restore bone mass, or have additional risk factors. I have reviewed this study and agree with the findings. [facility] Electronically Signed   By: Frederico Hamman M.D.   On: 04/30/2023 12:27    Assessment/Plan  1. Age-related osteoporosis without current pathological fracture -  T-score - 2.5, ranging in osteoporosis - denosumab (PROLIA) 60 MG/ML SOSY injection; Inject 60 mg into the skin every 6 (six) months.  Dispense: 1 mL; Refill: 0 -  Vitamin D, 25-hydroxy -  prior authorization will be  done  2. COVID-19 Runny nose - POC COVID-19 positive - nirmatrelvir/ritonavir (PAXLOVID) 20 x 150 MG & 10 x 100MG  TABS; Take 3 tablets by mouth 2 (two) times daily for 5 days. (Take nirmatrelvir 150 mg two tablets twice daily for 5 days and ritonavir 100 mg one tablet twice daily for 5 days) Patient  GFR is 67  Dispense: 30 tablet; Refill: 0    Labs/tests ordered:  POC COVID-19  Next appt:  Visit date not found

## 2023-05-02 NOTE — Telephone Encounter (Signed)
Patients daughter tested positive for covid and her mother was in close contact with her recently. Patient with runny nose, cough and fatigue. Shanda Bumps is asking for a covid at home kit to be sent to the local pharmacy ([facility]) in order for patient to be tested for free, otherwise patient would have to pay 100 dollars of more for a covid test. I offered for patient to be seen and Shanda Bumps declined stating symptoms are not severe enough to take an appointment slot and she would prefer to have her tested at home.

## 2023-05-02 NOTE — Patient Instructions (Signed)

## 2023-05-02 NOTE — Telephone Encounter (Signed)
Can you pls send Rx for the COVI-19 home kit test at her pharmacy. Thanks.

## 2023-05-02 NOTE — Telephone Encounter (Signed)
RX was added and pended for provider to review prior to message being forwarded to provider. RX sent!

## 2023-05-03 LAB — VITAMIN D 25 HYDROXY (VIT D DEFICIENCY, FRACTURES): Vit D, 25-Hydroxy: 42 ng/mL (ref 30–100)

## 2023-05-07 NOTE — Progress Notes (Signed)
-    vitamin D level within normal

## 2023-05-08 ENCOUNTER — Emergency Department (HOSPITAL_COMMUNITY): Payer: Medicare Other

## 2023-05-08 ENCOUNTER — Inpatient Hospital Stay (HOSPITAL_COMMUNITY): Payer: Medicare Other

## 2023-05-08 ENCOUNTER — Observation Stay (HOSPITAL_COMMUNITY)
Admission: EM | Admit: 2023-05-08 | Discharge: 2023-05-09 | Disposition: A | Discharge status: Home / Self Care | Payer: Medicare Other | Attending: Internal Medicine | Admitting: Internal Medicine

## 2023-05-08 ENCOUNTER — Encounter: Payer: Self-pay | Admitting: Adult Health

## 2023-05-08 ENCOUNTER — Encounter (HOSPITAL_COMMUNITY): Payer: Self-pay | Admitting: Emergency Medicine

## 2023-05-08 ENCOUNTER — Other Ambulatory Visit: Payer: Self-pay

## 2023-05-08 DIAGNOSIS — R410 Disorientation, unspecified: Secondary | ICD-10-CM | POA: Diagnosis present

## 2023-05-08 DIAGNOSIS — F0280 Dementia in other diseases classified elsewhere without behavioral disturbance: Secondary | ICD-10-CM | POA: Insufficient documentation

## 2023-05-08 DIAGNOSIS — G309 Alzheimer's disease, unspecified: Secondary | ICD-10-CM | POA: Diagnosis not present

## 2023-05-08 DIAGNOSIS — Z79899 Other long term (current) drug therapy: Secondary | ICD-10-CM | POA: Insufficient documentation

## 2023-05-08 DIAGNOSIS — E039 Hypothyroidism, unspecified: Secondary | ICD-10-CM | POA: Insufficient documentation

## 2023-05-08 DIAGNOSIS — Z859 Personal history of malignant neoplasm, unspecified: Secondary | ICD-10-CM | POA: Insufficient documentation

## 2023-05-08 DIAGNOSIS — R000 Tachycardia, unspecified: Secondary | ICD-10-CM | POA: Insufficient documentation

## 2023-05-08 DIAGNOSIS — G9341 Metabolic encephalopathy: Secondary | ICD-10-CM | POA: Insufficient documentation

## 2023-05-08 DIAGNOSIS — F17210 Nicotine dependence, cigarettes, uncomplicated: Secondary | ICD-10-CM | POA: Diagnosis not present

## 2023-05-08 DIAGNOSIS — R4182 Altered mental status, unspecified: Secondary | ICD-10-CM | POA: Insufficient documentation

## 2023-05-08 DIAGNOSIS — R0902 Hypoxemia: Secondary | ICD-10-CM

## 2023-05-08 DIAGNOSIS — R413 Other amnesia: Secondary | ICD-10-CM | POA: Diagnosis present

## 2023-05-08 DIAGNOSIS — R509 Fever, unspecified: Secondary | ICD-10-CM | POA: Insufficient documentation

## 2023-05-08 DIAGNOSIS — I10 Essential (primary) hypertension: Secondary | ICD-10-CM | POA: Diagnosis not present

## 2023-05-08 DIAGNOSIS — R0682 Tachypnea, not elsewhere classified: Secondary | ICD-10-CM | POA: Insufficient documentation

## 2023-05-08 DIAGNOSIS — Z91040 Latex allergy status: Secondary | ICD-10-CM | POA: Diagnosis not present

## 2023-05-08 DIAGNOSIS — J9601 Acute respiratory failure with hypoxia: Secondary | ICD-10-CM | POA: Diagnosis not present

## 2023-05-08 DIAGNOSIS — U071 COVID-19: Secondary | ICD-10-CM | POA: Diagnosis not present

## 2023-05-08 DIAGNOSIS — J189 Pneumonia, unspecified organism: Secondary | ICD-10-CM | POA: Diagnosis not present

## 2023-05-08 LAB — COMPREHENSIVE METABOLIC PANEL
ALT: 11 U/L (ref 0–44)
AST: 15 U/L (ref 15–41)
Albumin: 4.5 g/dL (ref 3.5–5.0)
Alkaline Phosphatase: 103 U/L (ref 38–126)
Anion gap: 15 (ref 5–15)
BUN: 12 mg/dL (ref 8–23)
CO2: 23 mmol/L (ref 22–32)
Calcium: 9 mg/dL (ref 8.9–10.3)
Chloride: 98 mmol/L (ref 98–111)
Creatinine, Ser: 1.06 mg/dL — ABNORMAL HIGH (ref 0.44–1.00)
GFR, Estimated: 56 mL/min — ABNORMAL LOW (ref >60)
Glucose, Bld: 146 mg/dL — ABNORMAL HIGH (ref 70–99)
Potassium: 3.5 mmol/L (ref 3.5–5.1)
Sodium: 136 mmol/L (ref 135–145)
Total Bilirubin: 1.3 mg/dL — ABNORMAL HIGH (ref 0.3–1.2)
Total Protein: 7.9 g/dL (ref 6.5–8.1)

## 2023-05-08 LAB — URINALYSIS, W/ REFLEX TO CULTURE (INFECTION SUSPECTED)
Bilirubin Urine: NEGATIVE
Glucose, UA: NEGATIVE mg/dL
Hgb urine dipstick: NEGATIVE
Ketones, ur: 5 mg/dL — AB
Leukocytes,Ua: NEGATIVE
Nitrite: NEGATIVE
Protein, ur: 100 mg/dL — AB
Specific Gravity, Urine: 1.015 (ref 1.005–1.030)
pH: 6 (ref 5.0–8.0)

## 2023-05-08 LAB — CBC WITH DIFFERENTIAL/PLATELET
Abs Immature Granulocytes: 0.12 10*3/uL — ABNORMAL HIGH (ref 0.00–0.07)
Basophils Absolute: 0 10*3/uL (ref 0.0–0.1)
Basophils Relative: 0 %
Eosinophils Absolute: 0 10*3/uL (ref 0.0–0.5)
Eosinophils Relative: 0 %
HCT: 37.6 % (ref 36.0–46.0)
Hemoglobin: 12.2 g/dL (ref 12.0–15.0)
Immature Granulocytes: 1 %
Lymphocytes Relative: 4 %
Lymphs Abs: 0.8 10*3/uL (ref 0.7–4.0)
MCH: 30.7 pg (ref 26.0–34.0)
MCHC: 32.4 g/dL (ref 30.0–36.0)
MCV: 94.7 fL (ref 80.0–100.0)
Monocytes Absolute: 0.7 10*3/uL (ref 0.1–1.0)
Monocytes Relative: 3 %
Neutro Abs: 18.6 10*3/uL — ABNORMAL HIGH (ref 1.7–7.7)
Neutrophils Relative %: 92 %
Platelets: 207 10*3/uL (ref 150–400)
RBC: 3.97 MIL/uL (ref 3.87–5.11)
RDW: 13.2 % (ref 11.5–15.5)
WBC: 20.2 10*3/uL — ABNORMAL HIGH (ref 4.0–10.5)
nRBC: 0 % (ref 0.0–0.2)

## 2023-05-08 LAB — LACTIC ACID, PLASMA
Lactic Acid, Venous: 1.4 mmol/L (ref 0.5–1.9)
Lactic Acid, Venous: 1.4 mmol/L (ref 0.5–1.9)

## 2023-05-08 LAB — PROTIME-INR
INR: 1.1 (ref 0.8–1.2)
Prothrombin Time: 14 seconds (ref 11.4–15.2)

## 2023-05-08 LAB — APTT: aPTT: 25 seconds (ref 24–36)

## 2023-05-08 LAB — D-DIMER, QUANTITATIVE: D-Dimer, Quant: 0.66 ug/mL-FEU — ABNORMAL HIGH (ref 0.00–0.50)

## 2023-05-08 LAB — PROCALCITONIN: Procalcitonin: <0.1 ng/mL

## 2023-05-08 LAB — C-REACTIVE PROTEIN: CRP: 1.2 mg/dL — ABNORMAL HIGH (ref <1.0)

## 2023-05-08 MED ORDER — VENLAFAXINE HCL ER 75 MG PO CP24
150.0000 mg | ORAL_CAPSULE | Freq: Every day | ORAL | Status: DC
  Administered 2023-05-09: 150 mg via ORAL
  Filled 2023-05-08 (×2): qty 2

## 2023-05-08 MED ORDER — AZITHROMYCIN 500 MG PO TABS
500.0000 mg | ORAL_TABLET | Freq: Every day | ORAL | Status: DC
  Administered 2023-05-09: 500 mg via ORAL
  Filled 2023-05-08: qty 1

## 2023-05-08 MED ORDER — SODIUM CHLORIDE 0.9 % IV SOLN
1.0000 g | Freq: Once | INTRAVENOUS | Status: DC
  Filled 2023-05-08: qty 10

## 2023-05-08 MED ORDER — POLYETHYLENE GLYCOL 3350 17 G PO PACK: 17.0000 g | PACK | Freq: Every day | ORAL | Status: DC | PRN

## 2023-05-08 MED ORDER — MELATONIN 3 MG PO TABS
3.0000 mg | ORAL_TABLET | Freq: Every day | ORAL | Status: DC
  Administered 2023-05-08: 3 mg via ORAL
  Filled 2023-05-08: qty 1

## 2023-05-08 MED ORDER — LACTATED RINGERS IV BOLUS (SEPSIS)
500.0000 mL | Freq: Once | INTRAVENOUS | Status: AC
  Administered 2023-05-08: 500 mL via INTRAVENOUS

## 2023-05-08 MED ORDER — DONEPEZIL HCL 10 MG PO TABS
10.0000 mg | ORAL_TABLET | Freq: Every day | ORAL | Status: DC
  Administered 2023-05-08: 10 mg via ORAL
  Filled 2023-05-08: qty 1

## 2023-05-08 MED ORDER — ACETAMINOPHEN 325 MG PO TABS
650.0000 mg | ORAL_TABLET | Freq: Four times a day (QID) | ORAL | Status: DC | PRN
  Administered 2023-05-09: 650 mg via ORAL
  Filled 2023-05-08: qty 2

## 2023-05-08 MED ORDER — ACETAMINOPHEN 325 MG PO TABS
650.0000 mg | ORAL_TABLET | Freq: Once | ORAL | Status: AC
  Administered 2023-05-08: 650 mg via ORAL
  Filled 2023-05-08: qty 2

## 2023-05-08 MED ORDER — BUTALBITAL-APAP-CAFFEINE 50-325-40 MG PO TABS
2.0000 | ORAL_TABLET | Freq: Four times a day (QID) | ORAL | Status: DC | PRN
  Administered 2023-05-08 – 2023-05-09 (×2): 2 via ORAL
  Filled 2023-05-08 (×2): qty 2

## 2023-05-08 MED ORDER — ENOXAPARIN SODIUM 40 MG/0.4ML IJ SOSY
40.0000 mg | PREFILLED_SYRINGE | INTRAMUSCULAR | Status: DC
  Administered 2023-05-08 – 2023-05-09 (×2): 40 mg via SUBCUTANEOUS
  Filled 2023-05-08 (×2): qty 0.4

## 2023-05-08 MED ORDER — MIRABEGRON ER 50 MG PO TB24
50.0000 mg | ORAL_TABLET | Freq: Every day | ORAL | Status: DC
  Administered 2023-05-09: 50 mg via ORAL
  Filled 2023-05-08: qty 1

## 2023-05-08 MED ORDER — SODIUM CHLORIDE 0.9 % IV SOLN: INTRAVENOUS | Status: DC

## 2023-05-08 MED ORDER — SODIUM CHLORIDE 0.9 % IV SOLN
1.0000 g | Freq: Once | INTRAVENOUS | Status: AC
  Administered 2023-05-08: 1 g via INTRAVENOUS
  Filled 2023-05-08: qty 10

## 2023-05-08 MED ORDER — TRAZODONE HCL 50 MG PO TABS
50.0000 mg | ORAL_TABLET | Freq: Every day | ORAL | Status: DC
  Administered 2023-05-08: 50 mg via ORAL
  Filled 2023-05-08: qty 1

## 2023-05-08 MED ORDER — SODIUM CHLORIDE 0.9 % IV SOLN
500.0000 mg | Freq: Once | INTRAVENOUS | Status: AC
  Administered 2023-05-08: 500 mg via INTRAVENOUS
  Filled 2023-05-08: qty 5

## 2023-05-08 MED ORDER — ATORVASTATIN CALCIUM 40 MG PO TABS
40.0000 mg | ORAL_TABLET | Freq: Every day | ORAL | Status: DC
  Administered 2023-05-09: 40 mg via ORAL
  Filled 2023-05-08: qty 1

## 2023-05-08 MED ORDER — LAMOTRIGINE 100 MG PO TABS
200.0000 mg | ORAL_TABLET | Freq: Every day | ORAL | Status: DC
  Administered 2023-05-09: 200 mg via ORAL
  Filled 2023-05-08: qty 2

## 2023-05-08 MED ORDER — FERROUS SULFATE 325 (65 FE) MG PO TABS
325.0000 mg | ORAL_TABLET | Freq: Every day | ORAL | Status: DC
  Administered 2023-05-09: 325 mg via ORAL
  Filled 2023-05-08: qty 1

## 2023-05-08 MED ORDER — LISINOPRIL 20 MG PO TABS
40.0000 mg | ORAL_TABLET | Freq: Every day | ORAL | Status: DC
  Administered 2023-05-08 – 2023-05-09 (×2): 40 mg via ORAL
  Filled 2023-05-08 (×2): qty 2

## 2023-05-08 MED ORDER — METHYLPREDNISOLONE SODIUM SUCC 125 MG IJ SOLR
80.0000 mg | Freq: Every day | INTRAMUSCULAR | Status: DC
  Administered 2023-05-09: 80 mg via INTRAVENOUS
  Filled 2023-05-08: qty 2

## 2023-05-08 MED ORDER — LEVOTHYROXINE SODIUM 75 MCG PO TABS
75.0000 ug | ORAL_TABLET | Freq: Every day | ORAL | Status: DC
  Administered 2023-05-09: 75 ug via ORAL
  Filled 2023-05-08: qty 1

## 2023-05-08 MED ORDER — SODIUM CHLORIDE 0.9 % IV SOLN
2.0000 g | INTRAVENOUS | Status: DC
  Administered 2023-05-09: 2 g via INTRAVENOUS
  Filled 2023-05-08: qty 20

## 2023-05-08 MED ORDER — METHYLPREDNISOLONE SODIUM SUCC 40 MG IJ SOLR
40.0000 mg | Freq: Two times a day (BID) | INTRAMUSCULAR | Status: DC
  Administered 2023-05-08: 40 mg via INTRAVENOUS
  Filled 2023-05-08: qty 1

## 2023-05-08 NOTE — ED Provider Notes (Signed)
[place] EMERGENCY DEPARTMENT AT [facility] Provider Note   CSN: [id number] Arrival date & time: 05/08/23  1054     History  No chief complaint on file.   Shannon Hart is a 72 y.o. female.  HPI 72 year old female who history of COVID diagnosed 7 days ago, traumatic brain injury, dementia presents today from home with decreased mental status.  EMS reports that they were called due to altered mental status and hypoxia.  On their arrival sats were in the 60s to 35s.  Patient was given albuterol for some wheezing and had sats increased to 99% with improvement of wheezing. Patient was placed on 4 L of nasal cannula and sats were in the upper 90s EMS reports that her blood sugar was in the 100s.  She was tachycardic and tachypneic with high blood pressure     Home Medications Prior to Admission medications   Medication Sig Start Date End Date Taking? Authorizing Provider  ascorbic acid (VITAMIN C) 500 MG tablet Take 500 mg by mouth 2 (two) times daily.    [provider]  atorvastatin (LIPITOR) 40 MG tablet Take 1 tablet (40 mg total) by mouth daily. 01/14/23   Medina-Vargas, Monina C, NP  COVID-19 At-Home Test KIT 1 Device by Other route as directed. 05/02/23   Medina-Vargas, Monina C, NP  denosumab (PROLIA) 60 MG/ML SOSY injection Inject 60 mg into the skin every 6 (six) months. 05/02/23   Medina-Vargas, Monina C, NP  donepezil (ARICEPT) 10 MG tablet Take 1 tablet (10 mg total) by mouth at bedtime. 01/17/23   Levert Feinstein, MD  ferrous sulfate 325 (65 FE) MG tablet Take 1 tablet (325 mg total) by mouth daily. 12/11/22   Medina-Vargas, Monina C, NP  ketoconazole (NIZORAL) 2 % cream Apply 1 Application topically daily.    [provider]  lamoTRIgine (LAMICTAL) 200 MG tablet Take 1 tablet (200 mg total) by mouth daily. 04/10/23   Stasia Cavalier, MD  levothyroxine (SYNTHROID) 75 MCG tablet Take 1 tablet (75 mcg total) by mouth daily. 11/16/22   Medina-Vargas, Monina C, NP   lisinopril (ZESTRIL) 40 MG tablet Take 1 tablet (40 mg total) by mouth daily. 12/17/22   Medina-Vargas, Monina C, NP  loratadine (CLARITIN) 10 MG tablet Take 10 mg by mouth daily.    [provider]  melatonin 3 MG TABS tablet Take 3 mg by mouth at bedtime.    [provider]  mirabegron ER (MYRBETRIQ) 50 MG TB24 tablet Take 50 mg by mouth daily.    [provider]  nitrofurantoin (MACRODANTIN) 100 MG capsule Take 1 capsule (100 mg total) by mouth 2 (two) times daily. 01/21/23   Alfredo Martinez, MD  Omega-3 Fatty Acids (FISH OIL) 1000 MG CAPS Take 1 capsule by mouth daily.    [provider]  pregabalin (LYRICA) 50 MG capsule Take 1 capsule by mouth twice daily. 04/24/23   Medina-Vargas, Monina C, NP  traZODone (DESYREL) 50 MG tablet Take 1 tablet (50 mg total) by mouth at bedtime. 04/10/23   Stasia Cavalier, MD  trospium (SANCTURA) 20 MG tablet Take 20 mg by mouth 2 (two) times daily. 08/27/20   [provider]  venlafaxine XR (EFFEXOR-XR) 150 MG 24 hr capsule Take 1 capsule (150 mg total) by mouth daily with breakfast. 03/04/23 05/14/25  Medina-Vargas, Monina C, NP      Allergies    Latex, Other, and Wound dressing adhesive    Review of Systems   Review  of Systems  Physical Exam Updated Vital Signs BP (!) 168/95   Pulse 87   Temp (!) 101.3 F (38.5 C) (Oral)   Resp (!) 21   Ht 1.6 m (5\' 3" )   Wt 70.8 kg   SpO2 95%   BMI 27.63 kg/m  Physical Exam Vitals and nursing note reviewed.  HENT:     Head: Normocephalic.     Right Ear: External ear normal.     Left Ear: External ear normal.     Nose: Nose normal.     Mouth/Throat:     Mouth: Mucous membranes are dry.  Eyes:     Pupils: Pupils are equal, round, and reactive to light.  Cardiovascular:     Rate and Rhythm: Normal rate.     Pulses: Normal pulses.  Pulmonary:     Breath sounds: Rhonchi present.     Comments: Patient with tachypnea and some expiratory wheezes as well as rhonchi at  right base Abdominal:     Palpations: Abdomen is soft.  Musculoskeletal:        General: Normal range of motion.     Cervical back: Normal range of motion.  Skin:    General: Skin is warm and dry.  Neurological:     General: No focal deficit present.     Mental Status: She is alert.  Psychiatric:        Mood and Affect: Mood normal.     ED Results / Procedures / Treatments   Labs (all labs ordered are listed, but only abnormal results are displayed) Labs Reviewed  COMPREHENSIVE METABOLIC PANEL - Abnormal; Notable for the following components:      Result Value   Glucose, Bld 146 (*)    Creatinine, Ser 1.06 (*)    Total Bilirubin 1.3 (*)    GFR, Estimated 56 (*)    All other components within normal limits  CBC WITH DIFFERENTIAL/PLATELET - Abnormal; Notable for the following components:   WBC 20.2 (*)    Neutro Abs 18.6 (*)    Abs Immature Granulocytes 0.12 (*)    All other components within normal limits  CULTURE, BLOOD (ROUTINE X 2)  CULTURE, BLOOD (ROUTINE X 2)  LACTIC ACID, PLASMA  PROTIME-INR  APTT  LACTIC ACID, PLASMA  URINALYSIS, W/ REFLEX TO CULTURE (INFECTION SUSPECTED)    EKG EKG Interpretation  Date/Time:  Wednesday May 08 2023 11:14:41 EDT Ventricular Rate:  88 PR Interval:  169 QRS Duration: 101 QT Interval:  417 QTC Calculation: 505 R Axis:   31 Text Interpretation: Sinus rhythm Prolonged QT interval Confirmed by Margarita Grizzle [phone]) on 05/08/2023 1:37:24 PM  Radiology CT Head Wo Contrast  Result Date: 05/08/2023 CLINICAL DATA:  Headache, new onset (Age >= 51y) EXAM: CT HEAD WITHOUT CONTRAST TECHNIQUE: Contiguous axial images were obtained from the base of the skull through the vertex without intravenous contrast. RADIATION DOSE REDUCTION: This exam was performed according to the departmental dose-optimization program which includes automated exposure control, adjustment of the mA and/or kV according to patient size and/or use of iterative  reconstruction technique. COMPARISON:  None Available. FINDINGS: Brain: No evidence of acute infarction, hemorrhage, hydrocephalus, extra-axial collection or mass lesion/mass effect. Vascular: No hyperdense vessel or unexpected calcification. Skull: Normal. Negative for fracture or focal lesion. Sinuses/Orbits: No middle ear or mastoid effusion. Postsurgical changes from prior endoscopic sinus surgery with a left maxillary antrostomy and inferior ethmoidectomy, as well as sphenoidotomies. Bilateral lens replacement. Orbits are otherwise unremarkable. Other: None. IMPRESSION: No  acute intracranial abnormality. Electronically Signed   By: Lorenza Cambridge M.D.   On: 05/08/2023 13:25   DG Chest Port 1 View  Result Date: 05/08/2023 CLINICAL DATA:  Altered mental status and hypoxia EXAM: PORTABLE CHEST 1 VIEW COMPARISON:  Chest radiograph dated 12/14/2020, CT chest dated 01/11/2023 FINDINGS: Normal lung volumes. Right lower lung nodule, better evaluated on prior chest CT. No pleural effusion or pneumothorax. The heart size and mediastinal contours are within normal limits. Partially imaged right shoulder arthroplasty. IMPRESSION: 1. No acute cardiopulmonary process. 2. Right lower lung nodule, better evaluated on prior chest CT. Electronically Signed   By: Agustin Cree M.D.   On: 05/08/2023 11:57    Procedures .Critical Care  Performed by: Margarita Grizzle, MD Authorized by: Margarita Grizzle, MD   Critical care provider statement:    Critical care time (minutes):  30   Critical care was necessary to treat or prevent imminent or life-threatening deterioration of the following conditions:  Respiratory failure   Critical care was time spent personally by me on the following activities:  Development of treatment plan with patient or surrogate, discussions with consultants, evaluation of patient's response to treatment, examination of patient, ordering and review of laboratory studies, ordering and review of radiographic  studies, ordering and performing treatments and interventions, pulse oximetry, re-evaluation of patient's condition and review of old charts     Medications Ordered in ED Medications  lactated ringers bolus 500 mL (0 mLs Intravenous Stopped 05/08/23 1230)  cefTRIAXone (ROCEPHIN) 1 g in sodium chloride 0.9 % 100 mL IVPB (1 g Intravenous New Bag/Given 05/08/23 1322)  azithromycin (ZITHROMAX) 500 mg in sodium chloride 0.9 % 250 mL IVPB (500 mg Intravenous New Bag/Given 05/08/23 1322)  acetaminophen (TYLENOL) tablet 650 mg (650 mg Oral Given 05/08/23 1338)    ED Course/ Medical Decision Making/ A&P Clinical Course as of 05/08/23 1429  Wed May 08, 2023  1254 CBC reviewed interpreted and significant for leukocytosis with white blood cell count of 20,000 with 18% neutrophils otherwise within normal limits [DR]  1254 Pleat metabolic panel is reviewed and interpreted and is significant for normal electrolytes, mild hyperglycemia [DR]  1254 Chest x-Leira Regino reviewed interpreted within normal limits [DR]    Clinical Course User Index [DR] Margarita Grizzle, MD                             Medical Decision Making Amount and/or Complexity of Data Reviewed Labs: ordered. Radiology: ordered. ECG/medicine tests: ordered.   72 year old female diagnosed with COVID 1 week ago presents today with increased confusion and hypoxia.  Patient is febrile on presentation.  She does wake up and speak to me and does not appear to have any focal neurological deficits Differential diagnosis includes but is not limited to worsening of COVID and possible COVID pneumonia, secondary bacterial infection including pneumonia, UTI, volume depletion, hypoglycemia, metabolic abnormalities Patient is febrile here and has white blood cell count of 20,000.  This is most consistent with infectious etiology Basic metabolic panel does not show any definite etiology. Blood cultures obtained Patient with clear chest x-Catheryne Deford.  Patient received  Rocephin and Zithromax due to possibility of evolving secondary lung infection Patient is tolerating oxygen and has normal saturations on 4 L nasal cannula.  Temperature increased to 101.3 and patient has received acetaminophen Patient had blood cultures obtained and first lactic acid is normal.  She has been normotensive with normal heart rate and doubt sepsis  Care discussed with Dr. Renford Dills who will see for admission       Final Clinical Impression(s) / ED Diagnoses Final diagnoses:  COVID  Hypoxia    Rx / DC Orders ED Discharge Orders     None         Margarita Grizzle, MD 05/08/23 1429

## 2023-05-08 NOTE — Telephone Encounter (Signed)
I printed documents and scanned them into onbase for you to upload into chart.

## 2023-05-08 NOTE — ED Notes (Signed)
ED TO INPATIENT HANDOFF REPORT  ED Nurse Name and Phone #: Antony Odea  S Name/Age/Gender Shannon Hart 72 y.o. female Room/Bed: 036C/036C  Code Status   Code Status: Full Code  Home/SNF/Other Home Patient oriented to: self and time Is this baseline? No   Triage Complete: Triage complete  Chief Complaint COVID-19 [U07.1]  Triage Note Pt BIB EMS from home for AMS, hypoxia. Pt dx with COVID 7 days ago.  Pt was lethargic and not her normal A&O self per family. She had not taken any of her meds yest or today.  Per EMS pt sats were 66-84% with a 92% once on RA.  Pt was given an albuterol neb and placed on 4L and was sattign 99%, wheezing improved. .    On arrival pt is 99% on RA but tachypneic so placed on 2L [place] for support.  Pt has hx of CSF leak in 2015 that was surgically corrected but had begun again recently per family.   EMS BP 170/90 HR 90 99% 4L   Allergies Allergies  Allergen Reactions   Latex Rash   Other Rash    Tape made her have blisters   Wound Dressing Adhesive Rash    Level of Care/Admitting Diagnosis ED Disposition     ED Disposition  Admit   Condition  --   Comment  Hospital Area: [facility] [[id number]]  Level of Care: Med-Surg [16]  May admit patient to [facility] or [facility] if equivalent level of care is available:: No  Covid Evaluation: Confirmed COVID Positive  Diagnosis: COVID-19 [[id number]]  Admitting Physician: Burnadette Pop [[id number]]  Attending Physician: Burnadette Pop [[id number]]  Certification:: I certify this patient will need inpatient services for at least 2 midnights  Estimated Length of Stay: 2          B Medical/Surgery History Past Medical History:  Diagnosis Date   Allergy    Alzheimer disease (HCC)    Anxiety    Arthritis    Bipolar 1 disorder (HCC)    Cancer (HCC)    Cataract    CSF leak    Depression    Hyperlipidemia    Hypertension    Neuromuscular disorder (HCC)    Sleep apnea    TBI  (traumatic brain injury) (HCC)    Thyroid disease    TIA (transient ischemic attack)    Vitamin B6 deficiency    Vitamin D deficiency    Past Surgical History:  Procedure Laterality Date   ABDOMINAL HYSTERECTOMY     DG SHOULDER RIGHT COMPLETE  2023   RECTOVAGINAL FISTULA CLOSURE     ROTATOR CUFF REPAIR     THYROIDECTOMY  2004     A IV Location/Drains/Wounds Patient Lines/Drains/Airways Status     Active Line/Drains/Airways     Name Placement date Placement time Site Days   Peripheral IV 05/08/23 20 G Left Antecubital 05/08/23  0355  Antecubital  less than 1   Peripheral IV 05/08/23 20 G Right Antecubital 05/08/23  1132  Antecubital  less than 1   External Urinary Catheter 05/08/23  1328  --  less than 1            Intake/Output Last 24 hours No intake or output data in the 24 hours ending 05/08/23 1618  Labs/Imaging Results for orders placed or performed during the hospital encounter of 05/08/23 (from the past 48 hour(s))  Lactic acid, plasma     Status: None   Collection Time: 05/08/23 11:06  AM  Result Value Ref Range   Lactic Acid, Venous 1.4 0.5 - 1.9 mmol/L    Comment: Performed at [facility], [address] [place], [place] [address]  Comprehensive metabolic panel     Status: Abnormal   Collection Time: 05/08/23 11:34 AM  Result Value Ref Range   Sodium 136 135 - 145 mmol/L   Potassium 3.5 3.5 - 5.1 mmol/L   Chloride 98 98 - 111 mmol/L   CO2 23 22 - 32 mmol/L   Glucose, Bld 146 (H) 70 - 99 mg/dL    Comment: Glucose reference range applies only to samples taken after fasting for at least 8 hours.   BUN 12 8 - 23 mg/dL   Creatinine, Ser 6.04 (H) 0.44 - 1.00 mg/dL   Calcium 9.0 8.9 - 54.0 mg/dL   Total Protein 7.9 6.5 - 8.1 g/dL   Albumin 4.5 3.5 - 5.0 g/dL   AST 15 15 - 41 U/L   ALT 11 0 - 44 U/L   Alkaline Phosphatase 103 38 - 126 U/L   Total Bilirubin 1.3 (H) 0.3 - 1.2 mg/dL   GFR, Estimated 56 (L) >60 mL/min    Comment: (NOTE) Calculated  using the CKD-EPI Creatinine Equation (2021)    Anion gap 15 5 - 15    Comment: Performed at [facility], [address], [place], [place] [address]  CBC with Differential     Status: Abnormal   Collection Time: 05/08/23 11:34 AM  Result Value Ref Range   WBC 20.2 (H) 4.0 - 10.5 K/uL   RBC 3.97 3.87 - 5.11 MIL/uL   Hemoglobin 12.2 12.0 - 15.0 g/dL   HCT 14.7 82.9 - 56.2 %   MCV 94.7 80.0 - 100.0 fL   MCH 30.7 26.0 - 34.0 pg   MCHC 32.4 30.0 - 36.0 g/dL   RDW 13.0 86.5 - 78.4 %   Platelets 207 150 - 400 K/uL   nRBC 0.0 0.0 - 0.2 %   Neutrophils Relative % 92 %   Neutro Abs 18.6 (H) 1.7 - 7.7 K/uL   Lymphocytes Relative 4 %   Lymphs Abs 0.8 0.7 - 4.0 K/uL   Monocytes Relative 3 %   Monocytes Absolute 0.7 0.1 - 1.0 K/uL   Eosinophils Relative 0 %   Eosinophils Absolute 0.0 0.0 - 0.5 K/uL   Basophils Relative 0 %   Basophils Absolute 0.0 0.0 - 0.1 K/uL   Immature Granulocytes 1 %   Abs Immature Granulocytes 0.12 (H) 0.00 - 0.07 K/uL    Comment: Performed at [facility], [address] [place], [place] [address]  Protime-INR     Status: None   Collection Time: 05/08/23 11:34 AM  Result Value Ref Range   Prothrombin Time 14.0 11.4 - 15.2 seconds   INR 1.1 0.8 - 1.2    Comment: (NOTE) INR goal varies based on device and disease states. Performed at [facility], [address] [place], [place] [address]   APTT     Status: None   Collection Time: 05/08/23 11:34 AM  Result Value Ref Range   aPTT 25 24 - 36 seconds    Comment: Performed at [facility], [address], [place], [place] [address]  Lactic acid, plasma     Status: None   Collection Time: 05/08/23  1:06 PM  Result Value Ref Range   Lactic Acid, Venous 1.4 0.5 - 1.9 mmol/L  Comment: Performed at [facility], [address] [place], [place] [address]  Urinalysis, w/ Reflex to Culture (Infection Suspected) -Urine, Catheterized     Status: Abnormal   Collection Time: 05/08/23   3:00 PM  Result Value Ref Range   Specimen Source URINE, CATHETERIZED    Color, Urine YELLOW YELLOW   APPearance CLEAR CLEAR   Specific Gravity, Urine 1.015 1.005 - 1.030   pH 6.0 5.0 - 8.0   Glucose, UA NEGATIVE NEGATIVE mg/dL   Hgb urine dipstick NEGATIVE NEGATIVE   Bilirubin Urine NEGATIVE NEGATIVE   Ketones, ur 5 (A) NEGATIVE mg/dL   Protein, ur 604 (A) NEGATIVE mg/dL   Nitrite NEGATIVE NEGATIVE   Leukocytes,Ua NEGATIVE NEGATIVE   RBC / HPF 0-5 0 - 5 RBC/hpf   WBC, UA 0-5 0 - 5 WBC/hpf    Comment:        Reflex urine culture not performed if WBC <=10, OR if Squamous epithelial cells >5. If Squamous epithelial cells >5 suggest recollection.    Bacteria, UA RARE (A) NONE SEEN   Squamous Epithelial / HPF 0-5 0 - 5 /HPF    Comment: Performed at [facility], [address], [place], [place] [address]   CT Head Wo Contrast  Result Date: 05/08/2023 CLINICAL DATA:  Headache, new onset (Age >= 51y) EXAM: CT HEAD WITHOUT CONTRAST TECHNIQUE: Contiguous axial images were obtained from the base of the skull through the vertex without intravenous contrast. RADIATION DOSE REDUCTION: This exam was performed according to the departmental dose-optimization program which includes automated exposure control, adjustment of the mA and/or kV according to patient size and/or use of iterative reconstruction technique. COMPARISON:  None Available. FINDINGS: Brain: No evidence of acute infarction, hemorrhage, hydrocephalus, extra-axial collection or mass lesion/mass effect. Vascular: No hyperdense vessel or unexpected calcification. Skull: Normal. Negative for fracture or focal lesion. Sinuses/Orbits: No middle ear or mastoid effusion. Postsurgical changes from prior endoscopic sinus surgery with a left maxillary antrostomy and inferior ethmoidectomy, as well as sphenoidotomies. Bilateral lens replacement. Orbits are otherwise unremarkable. Other: None. IMPRESSION: No acute intracranial abnormality.  Electronically Signed   By: Lorenza Cambridge M.D.   On: 05/08/2023 13:25   DG Chest Port 1 View  Result Date: 05/08/2023 CLINICAL DATA:  Altered mental status and hypoxia EXAM: PORTABLE CHEST 1 VIEW COMPARISON:  Chest radiograph dated 12/14/2020, CT chest dated 01/11/2023 FINDINGS: Normal lung volumes. Right lower lung nodule, better evaluated on prior chest CT. No pleural effusion or pneumothorax. The heart size and mediastinal contours are within normal limits. Partially imaged right shoulder arthroplasty. IMPRESSION: 1. No acute cardiopulmonary process. 2. Right lower lung nodule, better evaluated on prior chest CT. Electronically Signed   By: Agustin Cree M.D.   On: 05/08/2023 11:57    Pending Labs Unresulted Labs (From admission, onward)     Start     Ordered   05/09/23 0500  Comprehensive metabolic panel  Tomorrow morning,   R        05/08/23 1438   05/09/23 0500  CBC  Tomorrow morning,   R        05/08/23 1438   05/08/23 1511  Urine Culture (for pregnant, neutropenic or urologic patients or patients with an indwelling urinary catheter)  (Urine Labs)  Once,   R       Question:  Indication  Answer:  Altered mental status (if no other cause identified)   05/08/23 1510   05/08/23 1509  Procalcitonin  Add-on,  AD       References:    Procalcitonin Lower Respiratory Tract Infection AND Sepsis Procalcitonin Algorithm   05/08/23 1509   05/08/23 1440  C-reactive protein  Daily,   R      05/08/23 1439   05/08/23 1439  D-dimer, quantitative  Once,   R        05/08/23 1439   05/08/23 1106  Blood Culture (routine x 2)  (Undifferentiated presentation (screening labs and basic nursing orders))  BLOOD CULTURE X 2,   STAT      05/08/23 1105            Vitals/Pain Today's Vitals   05/08/23 1345 05/08/23 1500 05/08/23 1530 05/08/23 1618  BP: (!) 168/95 (!) 153/95    Pulse: 87 92 88   Resp: (!) 21 (!) 33    Temp:    99.2 F (37.3 C)  TempSrc:    Oral  SpO2: 95% 98% 100%   Weight:       Height:        Isolation Precautions Airborne and Contact precautions  Medications Medications  atorvastatin (LIPITOR) tablet 40 mg (has no administration in time range)  lisinopril (ZESTRIL) tablet 40 mg (has no administration in time range)  donepezil (ARICEPT) tablet 10 mg (has no administration in time range)  traZODone (DESYREL) tablet 50 mg (has no administration in time range)  venlafaxine XR (EFFEXOR-XR) 24 hr capsule 150 mg (has no administration in time range)  levothyroxine (SYNTHROID) tablet 75 mcg (has no administration in time range)  mirabegron ER (MYRBETRIQ) tablet 50 mg (has no administration in time range)  ferrous sulfate tablet 325 mg (has no administration in time range)  melatonin tablet 3 mg (has no administration in time range)  lamoTRIgine (LAMICTAL) tablet 200 mg (has no administration in time range)  enoxaparin (LOVENOX) injection 40 mg (has no administration in time range)  polyethylene glycol (MIRALAX / GLYCOLAX) packet 17 g (has no administration in time range)  methylPREDNISolone sodium succinate (SOLU-MEDROL) 40 mg/mL injection 40 mg (has no administration in time range)  0.9 %  sodium chloride infusion (has no administration in time range)  azithromycin (ZITHROMAX) tablet 500 mg (has no administration in time range)  cefTRIAXone (ROCEPHIN) 2 g in sodium chloride 0.9 % 100 mL IVPB (has no administration in time range)  cefTRIAXone (ROCEPHIN) 1 g in sodium chloride 0.9 % 100 mL IVPB (has no administration in time range)  lactated ringers bolus 500 mL (0 mLs Intravenous Stopped 05/08/23 1230)  cefTRIAXone (ROCEPHIN) 1 g in sodium chloride 0.9 % 100 mL IVPB (0 g Intravenous Stopped 05/08/23 1450)  azithromycin (ZITHROMAX) 500 mg in sodium chloride 0.9 % 250 mL IVPB (0 mg Intravenous Stopped 05/08/23 1451)  acetaminophen (TYLENOL) tablet 650 mg (650 mg Oral Given 05/08/23 1338)    Mobility walks with person assist     Focused Assessments Neuro Assessment  Handoff:  Swallow screen pass?  Unsure if done or ordered, has been confused and SOB Cardiac Rhythm: Normal sinus rhythm       Neuro Assessment: Exceptions to [facility] Neuro Checks:      Has TPA been given? No If patient is a Neuro Trauma and patient is going to OR before floor call report to 4N Charge nurse: [phone] or [phone]  , No TPA   R Recommendations: See Admitting Provider Note  Report given to:   Additional Notes: Pt moved to holding about a hour ago, AO2-3 for me, on O2, and purewick,  family at bedside,

## 2023-05-08 NOTE — H&P (Signed)
= History and Physical    Shannon Hart ZOX:[id number] DOB: 03/14/1951 DOA: 05/08/2023  PCP: Gillis Santa, NP   Patient coming from: Home    Chief Complaint: Lethargy, shortness of breath, confusion  HPI: Shannon Hart is a 72 y.o. female with medical history significant of dementia, hypothyroidism, hypertension, hyperlipidemia, traumatic brain injury who was brought by her family to the Emergency Department for the evaluation increased confusion from baseline, lethargy.  She was diagnosed with COVID about 7 days ago.  Patient has been being lethargic, more confused from baseline.  She was having poor oral intake and was not taking her medications.  When EMS arrived, she was saturating in the range of 66 to 84%.  She was found to be wheezing.  Patient was given nebulizer treatment and was placed on 4 L of oxygen and was brought to the emergency department. As per the daughter, she lives alone.  She is usually alert oriented at baseline, she might have some days of confusion.  She ambulates fine when she is good.  Her daughter lives about 3 miles away.  Daughter found her to be febrile this morning and called EMS. Patient seen and examined at bedside in the emergency department.  During my evaluation, she was hemodynamically stable.  She was put on 2 L of oxygen.  She did not appear short of breath, not coughing.  Daughter is present at the bedside.  She is very concerned about her mother's confusion. No report of chest pain, abdomen pain, nausea, vomiting, diarrhea, hematochezia or melena or dysuria.  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  ED Course: Febrile on presentation.  Put on 2 L of oxygen.  Started on antibiotics.  COVID came out to be positive.  Chest x-ray did not show any pneumonia.  Review of Systems: As per HPI otherwise 10 point review of systems negative.    Past Medical History:  Diagnosis Date   Allergy    Alzheimer disease (HCC)     Anxiety    Arthritis    Bipolar 1 disorder (HCC)    Cancer (HCC)    Cataract    CSF leak    Depression    Hyperlipidemia    Hypertension    Neuromuscular disorder (HCC)    Sleep apnea    TBI (traumatic brain injury) (HCC)    Thyroid disease    TIA (transient ischemic attack)    Vitamin B6 deficiency    Vitamin D deficiency     Past Surgical History:  Procedure Laterality Date   ABDOMINAL HYSTERECTOMY     DG SHOULDER RIGHT COMPLETE  2023   RECTOVAGINAL FISTULA CLOSURE     ROTATOR CUFF REPAIR     THYROIDECTOMY  2004     reports that she has been smoking cigarettes. She has a 25.50 pack-year smoking history. She has been exposed to tobacco smoke. She has never used smokeless tobacco. She reports that she does not currently use alcohol. She reports that she does not currently use drugs.  Allergies  Allergen Reactions   Latex Rash   Other Rash    Tape made her have blisters   Wound Dressing Adhesive Rash    Family History  Problem Relation Age of Onset   Colon cancer Neg Hx    Colon polyps Neg Hx    Esophageal cancer Neg Hx    Rectal cancer Neg Hx    Stomach cancer Neg Hx  Prior to Admission medications   Medication Sig Start Date End Date Taking? Authorizing Provider  ascorbic acid (VITAMIN C) 500 MG tablet Take 500 mg by mouth 2 (two) times daily.    [provider]  atorvastatin (LIPITOR) 40 MG tablet Take 1 tablet (40 mg total) by mouth daily. 01/14/23   Medina-Vargas, Monina C, NP  COVID-19 At-Home Test KIT 1 Device by Other route as directed. 05/02/23   Medina-Vargas, Monina C, NP  denosumab (PROLIA) 60 MG/ML SOSY injection Inject 60 mg into the skin every 6 (six) months. 05/02/23   Medina-Vargas, Monina C, NP  donepezil (ARICEPT) 10 MG tablet Take 1 tablet (10 mg total) by mouth at bedtime. 01/17/23   Levert Feinstein, MD  ferrous sulfate 325 (65 FE) MG tablet Take 1 tablet (325 mg total) by mouth daily. 12/11/22   Medina-Vargas, Monina C, NP  ketoconazole  (NIZORAL) 2 % cream Apply 1 Application topically daily.    [provider]  lamoTRIgine (LAMICTAL) 200 MG tablet Take 1 tablet (200 mg total) by mouth daily. 04/10/23   Stasia Cavalier, MD  levothyroxine (SYNTHROID) 75 MCG tablet Take 1 tablet (75 mcg total) by mouth daily. 11/16/22   Medina-Vargas, Monina C, NP  lisinopril (ZESTRIL) 40 MG tablet Take 1 tablet (40 mg total) by mouth daily. 12/17/22   Medina-Vargas, Monina C, NP  loratadine (CLARITIN) 10 MG tablet Take 10 mg by mouth daily.    [provider]  melatonin 3 MG TABS tablet Take 3 mg by mouth at bedtime.    [provider]  mirabegron ER (MYRBETRIQ) 50 MG TB24 tablet Take 50 mg by mouth daily.    [provider]  nitrofurantoin (MACRODANTIN) 100 MG capsule Take 1 capsule (100 mg total) by mouth 2 (two) times daily. 01/21/23   Alfredo Martinez, MD  Omega-3 Fatty Acids (FISH OIL) 1000 MG CAPS Take 1 capsule by mouth daily.    [provider]  pregabalin (LYRICA) 50 MG capsule Take 1 capsule by mouth twice daily. 04/24/23   Medina-Vargas, Monina C, NP  traZODone (DESYREL) 50 MG tablet Take 1 tablet (50 mg total) by mouth at bedtime. 04/10/23   Stasia Cavalier, MD  trospium (SANCTURA) 20 MG tablet Take 20 mg by mouth 2 (two) times daily. 08/27/20   [provider]  venlafaxine XR (EFFEXOR-XR) 150 MG 24 hr capsule Take 1 capsule (150 mg total) by mouth daily with breakfast. 03/04/23 05/14/25  Medina-Vargas, Avanell Shackleton C, NP    Physical Exam: Vitals:   05/08/23 1230 05/08/23 1231 05/08/23 1331 05/08/23 1345  BP: (!) 172/94 (!) 172/94  (!) 168/95  Pulse: 89 89  87  Resp: (!) 33 (!) 37  (!) 21  Temp:  (!) 100.9 F (38.3 C) (!) 101.3 F (38.5 C)   TempSrc:  Oral Oral   SpO2: (!) 89% (!) 89%  95%  Weight:      Height:        Constitutional: Overall comfortable, alert, awake but not oriented Vitals:   05/08/23 1230 05/08/23 1231 05/08/23 1331 05/08/23 1345  BP: (!) 172/94 (!) 172/94  (!) 168/95   Pulse: 89 89  87  Resp: (!) 33 (!) 37  (!) 21  Temp:  (!) 100.9 F (38.3 C) (!) 101.3 F (38.5 C)   TempSrc:  Oral Oral   SpO2: (!) 89% (!) 89%  95%  Weight:      Height:       Eyes: PERRL, lids and conjunctivae  normal ENMT: Mucous membranes are moist.  Neck: normal, supple, no masses, no thyromegaly Respiratory: Slightly diminished sounds on bases, no wheezing, no crackles. Normal respiratory effort. No accessory muscle use.  Cardiovascular: Regular rate and rhythm, no murmurs / rubs / gallops. No extremity edema.  Abdomen: no tenderness, no masses palpated. No hepatosplenomegaly. Bowel sounds positive.  Musculoskeletal: no clubbing / cyanosis. No joint deformity upper and lower extremities.  Skin: no rashes, lesions, ulcers. No induration Neurologic: CN 2-12 grossly intact.  Strength 5/5 in all 4.  Confused  Foley Catheter:None  Labs on Admission: I have personally reviewed following labs and imaging studies  CBC: Recent Labs  Lab 05/08/23 1134  WBC 20.2*  NEUTROABS 18.6*  HGB 12.2  HCT 37.6  MCV 94.7  PLT 207   Basic Metabolic Panel: Recent Labs  Lab 05/08/23 1134  NA 136  K 3.5  CL 98  CO2 23  GLUCOSE 146*  BUN 12  CREATININE 1.06*  CALCIUM 9.0   GFR: Estimated Creatinine Clearance: 46 mL/min (A) (by C-G formula based on SCr of 1.06 mg/dL (H)). Liver Function Tests: Recent Labs  Lab 05/08/23 1134  AST 15  ALT 11  ALKPHOS 103  BILITOT 1.3*  PROT 7.9  ALBUMIN 4.5   No results for input(s): "LIPASE", "AMYLASE" in the last 168 hours. No results for input(s): "AMMONIA" in the last 168 hours. Coagulation Profile: Recent Labs  Lab 05/08/23 1134  INR 1.1   Cardiac Enzymes: No results for input(s): "CKTOTAL", "CKMB", "CKMBINDEX", "TROPONINI" in the last 168 hours. BNP (last 3 results) No results for input(s): "PROBNP" in the last 8760 hours. HbA1C: No results for input(s): "HGBA1C" in the last 72 hours. CBG: No results for input(s): "GLUCAP" in  the last 168 hours. Lipid Profile: No results for input(s): "CHOL", "HDL", "LDLCALC", "TRIG", "CHOLHDL", "LDLDIRECT" in the last 72 hours. Thyroid Function Tests: No results for input(s): "TSH", "T4TOTAL", "FREET4", "T3FREE", "THYROIDAB" in the last 72 hours. Anemia Panel: No results for input(s): "VITAMINB12", "FOLATE", "FERRITIN", "TIBC", "IRON", "RETICCTPCT" in the last 72 hours. Urine analysis:    Component Value Date/Time   COLORURINE YELLOW (A) 06/15/2021 1346   APPEARANCEUR Cloudy (A) 01/14/2023 1343   LABSPEC 1.011 06/15/2021 1346   PHURINE 5.0 06/15/2021 1346   GLUCOSEU Negative 01/14/2023 1343   HGBUR NEGATIVE 06/15/2021 1346   BILIRUBINUR Negative 01/14/2023 1343   KETONESUR NEGATIVE 06/15/2021 1346   PROTEINUR 1+ (A) 01/14/2023 1343   PROTEINUR NEGATIVE 06/15/2021 1346   NITRITE Positive (A) 01/14/2023 1343   NITRITE NEGATIVE 06/15/2021 1346   LEUKOCYTESUR Trace (A) 01/14/2023 1343   LEUKOCYTESUR TRACE (A) 06/15/2021 1346    Radiological Exams on Admission: CT Head Wo Contrast  Result Date: 05/08/2023 CLINICAL DATA:  Headache, new onset (Age >= 51y) EXAM: CT HEAD WITHOUT CONTRAST TECHNIQUE: Contiguous axial images were obtained from the base of the skull through the vertex without intravenous contrast. RADIATION DOSE REDUCTION: This exam was performed according to the departmental dose-optimization program which includes automated exposure control, adjustment of the mA and/or kV according to patient size and/or use of iterative reconstruction technique. COMPARISON:  None Available. FINDINGS: Brain: No evidence of acute infarction, hemorrhage, hydrocephalus, extra-axial collection or mass lesion/mass effect. Vascular: No hyperdense vessel or unexpected calcification. Skull: Normal. Negative for fracture or focal lesion. Sinuses/Orbits: No middle ear or mastoid effusion. Postsurgical changes from prior endoscopic sinus surgery with a left maxillary antrostomy and inferior  ethmoidectomy, as well as sphenoidotomies. Bilateral lens replacement. Orbits are otherwise  unremarkable. Other: None. IMPRESSION: No acute intracranial abnormality. Electronically Signed   By: Lorenza Cambridge M.D.   On: 05/08/2023 13:25   DG Chest Port 1 View  Result Date: 05/08/2023 CLINICAL DATA:  Altered mental status and hypoxia EXAM: PORTABLE CHEST 1 VIEW COMPARISON:  Chest radiograph dated 12/14/2020, CT chest dated 01/11/2023 FINDINGS: Normal lung volumes. Right lower lung nodule, better evaluated on prior chest CT. No pleural effusion or pneumothorax. The heart size and mediastinal contours are within normal limits. Partially imaged right shoulder arthroplasty. IMPRESSION: 1. No acute cardiopulmonary process. 2. Right lower lung nodule, better evaluated on prior chest CT. Electronically Signed   By: Agustin Cree M.D.   On: 05/08/2023 11:57     Assessment/Plan Principal Problem:   COVID-19 Active Problems:   Memory loss   COVID-19 virus infection   Fever   CAP (community acquired pneumonia)   Acute hypoxic respiratory failure: Secondary to COVID infection.  Was found to be hypoxic, wheezing when EMS arrived.  Currently saturating fine on 2 L of oxygen.  Will try to wean.  COVID illness: Diagnosed with COVID about 7 days ago.  Presented here with lethargy, fever, hypoxia. Continue supportive care.  Started on steroid.  No indication for starting on antiviral for now. Continue to monitor inflammatory markers. Patient was febrile on presentation.  This is most likely from COVID.  X-ray of the chest did not show any pneumonia.  But empirically started on antibiotics due to  high-grade fever.We will check procalcitoninin.Has leucocytosis.Blood cultures/urine culture have been sent  Acute metabolic encephalopathy: Has been more confused since 1 to 2 days.  History of mild dementia/TBI.  CT scan did not show any acute intracranial abnormality.  Daughter states he has never been so confused.We  will check MRI of the brain  History of bipolar 1 disorder: On lamotrigine  History of depression: Trazodone,venlafaxine  Hyperlipidemia: Lipitor  Hypertension: Takes lisinopril at home, restarted.  Monitor blood pressure.  History of  dementia/TBI: Continue Aricept.  Delirium precautions.  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  Follows with ENT.  Has an appointment with ENT soon  Hypothyroidism: Continue Synthroid    Severity of Illness: The appropriate patient status for this patient is INPATIENT.   DVT prophylaxis: Lovenox Code Status:  Family Communication: Called and discussed with daughter on phone today Consults called: None     Burnadette Pop MD [facility]  05/08/2023, 2:43 PM

## 2023-05-08 NOTE — Plan of Care (Signed)
  Problem: Health Behavior/Discharge Planning: Goal: Ability to manage health-related needs will improve Outcome: Progressing   

## 2023-05-08 NOTE — ED Notes (Signed)
Blood cultures collected prior to ABX admin

## 2023-05-08 NOTE — ED Triage Notes (Signed)
Pt BIB EMS from home for AMS, hypoxia. Pt dx with COVID 7 days ago.  Pt was lethargic and not her normal A&O self per family. She had not taken any of her meds yest or today.  Per EMS pt sats were 66-84% with a 92% once on RA.  Pt was given an albuterol neb and placed on 4L and was sattign 99%, wheezing improved. .    On arrival pt is 99% on RA but tachypneic so placed on 2L [place] for support.  Pt has hx of CSF leak in 2015 that was surgically corrected but had begun again recently per family.   EMS BP 170/90 HR 90 99% 4L

## 2023-05-09 ENCOUNTER — Other Ambulatory Visit (HOSPITAL_COMMUNITY): Payer: Self-pay

## 2023-05-09 DIAGNOSIS — U071 COVID-19: Secondary | ICD-10-CM | POA: Diagnosis not present

## 2023-05-09 LAB — COMPREHENSIVE METABOLIC PANEL
ALT: 12 U/L (ref 0–44)
AST: 13 U/L — ABNORMAL LOW (ref 15–41)
Albumin: 3.8 g/dL (ref 3.5–5.0)
Alkaline Phosphatase: 84 U/L (ref 38–126)
Anion gap: 14 (ref 5–15)
BUN: 14 mg/dL (ref 8–23)
CO2: 23 mmol/L (ref 22–32)
Calcium: 8.7 mg/dL — ABNORMAL LOW (ref 8.9–10.3)
Chloride: 101 mmol/L (ref 98–111)
Creatinine, Ser: 1.06 mg/dL — ABNORMAL HIGH (ref 0.44–1.00)
GFR, Estimated: 56 mL/min — ABNORMAL LOW (ref >60)
Glucose, Bld: 155 mg/dL — ABNORMAL HIGH (ref 70–99)
Potassium: 3.8 mmol/L (ref 3.5–5.1)
Sodium: 138 mmol/L (ref 135–145)
Total Bilirubin: 0.7 mg/dL (ref 0.3–1.2)
Total Protein: 7.1 g/dL (ref 6.5–8.1)

## 2023-05-09 LAB — CULTURE, BLOOD (ROUTINE X 2): Culture: NO GROWTH

## 2023-05-09 LAB — CBC
HCT: 35.4 % — ABNORMAL LOW (ref 36.0–46.0)
Hemoglobin: 11.6 g/dL — ABNORMAL LOW (ref 12.0–15.0)
MCH: 30.9 pg (ref 26.0–34.0)
MCHC: 32.8 g/dL (ref 30.0–36.0)
MCV: 94.1 fL (ref 80.0–100.0)
Platelets: 199 10*3/uL (ref 150–400)
RBC: 3.76 MIL/uL — ABNORMAL LOW (ref 3.87–5.11)
RDW: 13.3 % (ref 11.5–15.5)
WBC: 15.3 10*3/uL — ABNORMAL HIGH (ref 4.0–10.5)
nRBC: 0 % (ref 0.0–0.2)

## 2023-05-09 LAB — C-REACTIVE PROTEIN: CRP: 14.9 mg/dL — ABNORMAL HIGH (ref <1.0)

## 2023-05-09 MED ORDER — ALBUTEROL SULFATE HFA 108 (90 BASE) MCG/ACT IN AERS: 2.0000 | INHALATION_SPRAY | Freq: Four times a day (QID) | RESPIRATORY_TRACT | 0 refills | Status: AC | PRN

## 2023-05-09 MED ORDER — PREDNISONE 20 MG PO TABS: 40.0000 mg | ORAL_TABLET | Freq: Every day | ORAL | 0 refills | Status: AC

## 2023-05-09 MED ORDER — AMOXICILLIN-POT CLAVULANATE 875-125 MG PO TABS: 1.0000 | ORAL_TABLET | Freq: Two times a day (BID) | ORAL | Status: DC

## 2023-05-09 MED ORDER — AMOXICILLIN-POT CLAVULANATE 875-125 MG PO TABS: 1.0000 | ORAL_TABLET | Freq: Two times a day (BID) | ORAL | 0 refills | Status: AC

## 2023-05-09 NOTE — Care Management CC44 (Signed)
Condition Code 44 Documentation Completed  Patient Details  Name: Chanise Stallard MRN: [id number] Date of Birth: [birth year]   Condition Code 44 given:    Patient signature on Condition Code 44 notice:    Documentation of 2 MD's agreement:    Code 44 added to claim:       Harriet Masson, RN 05/09/2023, 4:12 PM

## 2023-05-09 NOTE — Progress Notes (Signed)
MD asked RN to ambulate pt in hallways to assess her mobility and O2 requirement.  Ambulated pt from 5N to 5E on RA with O2 at 95%. S/Sx of DOE or SOB.  Pt stable during walk.  MD was secured chatted and notified.

## 2023-05-09 NOTE — Progress Notes (Signed)
Pt to be d/c home to self care. Reviewed AVS and informed pt of changes to his medication list with pt and daughter. Answered any pending questions pt had. No further questions at this moment. Assisted pt in getting dress and gathering her belongings. Removed PIV which was CDI and free from s/sx of infection. Autumn, Charge RN escorted pt down to visitors entrance where her ride awaits to take her home. Pt d/c in stable condition.

## 2023-05-09 NOTE — Care Management Obs Status (Signed)
MEDICARE OBSERVATION STATUS NOTIFICATION   Patient Details  Name: Shannon Hart MRN: [id number] Date of Birth: [birth year]   Medicare Observation Status Notification Given:       Harriet Masson, RN 05/09/2023, 4:12 PM

## 2023-05-09 NOTE — Discharge Summary (Signed)
Physician Discharge Summary  Shannon Hart WUJ:[id number] DOB: 05/09/1951 DOA: 05/08/2023  PCP: Gillis Santa, NP  Admit date: 05/08/2023 Discharge date: 05/09/2023  Admitted From: Home Disposition:  Home  Discharge Condition:Stable CODE STATUS:FULL Diet recommendation: Heart Healthy   Brief/Interim Summary: Patient is a 72 y.o. female with medical history significant of dementia, hypothyroidism, hypertension, hyperlipidemia, traumatic brain injury who was brought by her family to the Emergency Department for the evaluation increased confusion from baseline, lethargy.  She was diagnosed with COVID about 7 days ago.  Patient has been being lethargic, more confused from baseline.  She was having poor oral intake and was not taking her medications.  She was found to be hypoxic by EMS.  She was febrile on presentation.  COVID test, to be positive.  Chest x-ray did not show pneumonia.  Patient was found to be confused.  Patient was admitted for COVID encephalopathy, hypoxia.  Started on steroids, antibiotics.  She looks significantly better today. She is alert and oriented this morning.  Oxygen has been weaned successfully and she ambulated well on the hallway.  Patient wants to go home.  Medically stable for discharge  Following problems were addressed during the hospitalization:  Acute hypoxic respiratory failure: Secondary to COVID infection.  Was found to be hypoxic, wheezing when EMS arrived.  Currently saturating fine on room air   COVID illness: Diagnosed with COVID about 7 days ago.  Presented here with lethargy, fever, hypoxia. Started on steroid.  No indication for starting on antiviral for now. Patient was febrile on presentation.  This is most likely from COVID.  X-ray of the chest did not show any pneumonia.  But empirically started on antibiotics due to  high-grade fever,leucocytosis.negative procalcitoninin.Has leucocytosis has improved.Blood cultures/urine culture have been  sent:NGTD   Acute metabolic encephalopathy: Has been more confused since 1 to 2 days.  History of mild dementia/TBI.  CT scan did not show any acute intracranial abnormality.  MRI of the brain did not show any acute findings.  Mental status has improved today and she is alert and oriented   History of bipolar 1 disorder: On lamotrigine   History of depression: Trazodone,venlafaxine   Hyperlipidemia: Lipitor   Hypertension: Takes lisinopril at home, restarted.  Monitor blood pressure at home.   History of  dementia/TBI: Continue Aricept.  Delirium precautions.  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  Follows with ENT.  Has an appointment with ENT soon   Hypothyroidism: Continue Synthroid       Discharge Diagnoses:  Principal Problem:   COVID-19 Active Problems:   Memory loss   COVID-19 virus infection   Fever   CAP (community acquired pneumonia)    Discharge Instructions  Discharge Instructions     Diet - low sodium heart healthy   Complete by: As directed    Discharge instructions   Complete by: As directed    1)Please take prescribed medications as instructed 2)Follow up with your PCP in a week   Increase activity slowly   Complete by: As directed       Allergies as of 05/09/2023       Reactions   Latex Rash   Other Rash   Tape made her have blisters   Wound Dressing Adhesive Rash        Medication List     TAKE these medications    acetaminophen 650 MG CR tablet Commonly known as: TYLENOL Take 1,300 mg by mouth every 8 (eight) hours as needed for  pain.   albuterol 108 (90 Base) MCG/ACT inhaler Commonly known as: VENTOLIN HFA Inhale 2 puffs into the lungs every 6 (six) hours as needed for wheezing or shortness of breath.   amoxicillin-clavulanate 875-125 MG tablet Commonly known as: AUGMENTIN Take 1 tablet by mouth every 12 (twelve) hours for 3 days. Start taking on: May 10, 2023   ascorbic acid 500 MG tablet Commonly known as: VITAMIN C Take  500 mg by mouth 2 (two) times daily.   atorvastatin 40 MG tablet Commonly known as: LIPITOR Take 1 tablet (40 mg total) by mouth daily.   butalbital-acetaminophen-caffeine 50-325-40 MG tablet Commonly known as: FIORICET Take 2 tablets by mouth as needed for headache.   denosumab 60 MG/ML Sosy injection Commonly known as: PROLIA Inject 60 mg into the skin every 6 (six) months.   donepezil 10 MG tablet Commonly known as: ARICEPT Take 1 tablet (10 mg total) by mouth at bedtime.   ferrous sulfate 325 (65 FE) MG tablet Take 1 tablet (325 mg total) by mouth daily.   Fish Oil 1000 MG Caps Take 1 capsule by mouth daily.   lamoTRIgine 200 MG tablet Commonly known as: LAMICTAL Take 1 tablet (200 mg total) by mouth daily.   levothyroxine 75 MCG tablet Commonly known as: SYNTHROID Take 1 tablet (75 mcg total) by mouth daily.   lisinopril 40 MG tablet Commonly known as: ZESTRIL Take 1 tablet (40 mg total) by mouth daily.   loratadine 10 MG tablet Commonly known as: CLARITIN Take 10 mg by mouth daily.   melatonin 3 MG Tabs tablet Take 3 mg by mouth at bedtime.   Myrbetriq 50 MG Tb24 tablet Generic drug: mirabegron ER Take 50 mg by mouth daily.   Nurtec 75 MG Tbdp Generic drug: Rimegepant Sulfate Place 1 tablet under the tongue every other day.   predniSONE 20 MG tablet Commonly known as: DELTASONE Take 2 tablets (40 mg total) by mouth daily for 5 days. Start taking on: May 10, 2023   pregabalin 50 MG capsule Commonly known as: LYRICA Take 1 capsule by mouth twice daily.   traZODone 50 MG tablet Commonly known as: DESYREL Take 1 tablet (50 mg total) by mouth at bedtime.   trospium 20 MG tablet Commonly known as: SANCTURA Take 20 mg by mouth 2 (two) times daily.   venlafaxine XR 150 MG 24 hr capsule Commonly known as: EFFEXOR-XR Take 1 capsule (150 mg total) by mouth daily with breakfast.        Follow-up Information     Medina-Vargas, Monina C, NP.  Schedule an appointment as soon as possible for a visit in 1 week(s).   Specialty: Internal Medicine Contact information: [address] [place] [address] [phone]                Allergies  Allergen Reactions   Latex Rash   Other Rash    Tape made her have blisters   Wound Dressing Adhesive Rash    Consultations: None   Procedures/Studies: MR BRAIN WO CONTRAST  Result Date: 05/09/2023 CLINICAL DATA:  Delirium EXAM: MRI HEAD WITHOUT CONTRAST TECHNIQUE: Multiplanar, multiecho pulse sequences of the brain and surrounding structures were obtained without intravenous contrast. COMPARISON:  No prior MRI available, correlation is made with CT head 05/08/2023 FINDINGS: Brain: No restricted diffusion to suggest acute or subacute infarct. No acute hemorrhage, mass, mass effect, or midline shift. No hydrocephalus or extra-axial collection. Empty sella, with expansion of the sella. Normal craniocervical junction. No  hemosiderin deposition to suggest remote hemorrhage. Mildly advanced cerebral volume loss for age. Vascular: Normal arterial flow voids. Skull and upper cervical spine: Normal marrow signal. Sinuses/Orbits: Postsurgical changes in the sinuses. Mild mucosal thickening throughout the paranasal sinuses. Air-fluid level in the remaining sphenoid sinuses. Status post bilateral lens replacements. Other: Fluid in right mastoid air cells. IMPRESSION: 1. No evidence of acute or subacute infarct. 2. Air-fluid level in the sphenoid sinuses, which is nonspecific but can be seen in the setting of acute sinusitis. Correlate with symptoms. Electronically Signed   By: Wiliam Ke M.D.   On: 05/09/2023 00:18   CT Head Wo Contrast  Result Date: 05/08/2023 CLINICAL DATA:  Headache, new onset (Age >= 51y) EXAM: CT HEAD WITHOUT CONTRAST TECHNIQUE: Contiguous axial images were obtained from the base of the skull through the vertex without intravenous contrast. RADIATION DOSE REDUCTION: This  exam was performed according to the departmental dose-optimization program which includes automated exposure control, adjustment of the mA and/or kV according to patient size and/or use of iterative reconstruction technique. COMPARISON:  None Available. FINDINGS: Brain: No evidence of acute infarction, hemorrhage, hydrocephalus, extra-axial collection or mass lesion/mass effect. Vascular: No hyperdense vessel or unexpected calcification. Skull: Normal. Negative for fracture or focal lesion. Sinuses/Orbits: No middle ear or mastoid effusion. Postsurgical changes from prior endoscopic sinus surgery with a left maxillary antrostomy and inferior ethmoidectomy, as well as sphenoidotomies. Bilateral lens replacement. Orbits are otherwise unremarkable. Other: None. IMPRESSION: No acute intracranial abnormality. Electronically Signed   By: Lorenza Cambridge M.D.   On: 05/08/2023 13:25   DG Chest Port 1 View  Result Date: 05/08/2023 CLINICAL DATA:  Altered mental status and hypoxia EXAM: PORTABLE CHEST 1 VIEW COMPARISON:  Chest radiograph dated 12/14/2020, CT chest dated 01/11/2023 FINDINGS: Normal lung volumes. Right lower lung nodule, better evaluated on prior chest CT. No pleural effusion or pneumothorax. The heart size and mediastinal contours are within normal limits. Partially imaged right shoulder arthroplasty. IMPRESSION: 1. No acute cardiopulmonary process. 2. Right lower lung nodule, better evaluated on prior chest CT. Electronically Signed   By: Agustin Cree M.D.   On: 05/08/2023 11:57   DG Bone Density  Result Date: 04/30/2023 EXAM: DUAL X-RAY ABSORPTIOMETRY (DXA) FOR BONE MINERAL DENSITY IMPRESSION: Referring Physician:  Margit Banda MEDINA-VARGAS Your patient completed a bone mineral density test using GE Lunar iDXA system (analysis version: 16). Technologist:    lmn PATIENT: Name: Edris, Gargis Patient ID:  [id number] Birth Date: [birth year] Height:     62.5 in. Sex:         Female Measured:   04/30/2023 Weight:      157.4 lbs. Indications: Advanced Age, Caucasian, Effexor, Estrogen Deficient, Hypothyroid, Hysterectomy, Lamictal, Lyrica, Postmenopausal, Trazodone Fractures: NONE Treatments: Calcium (E943.0), Vitamin D (E933.5) ASSESSMENT: The BMD measured at Femur Neck Left is 0.690 g/cm2 with a T-score of -2.5. This patient is considered osteoporotic according to World Health Organization [facility]) criteria. The quality of the exam is good. Site Region Measured Date Measured Age YA BMD Significant CHANGE T-score AP Spine  L1-L4      5/30/2023    72.0         -1.7    0.978 g/cm2 DualFemur Neck Left  04/30/2023    72.0         -2.5    0.690 g/cm2 DualFemur Total Mean 04/30/2023    72.0         -2.1    0.747 g/cm2 World Health Organization [facility]) criteria for  post-menopausal, Caucasian Women: Normal       T-score at or above -1 SD Osteopenia   T-score between -1 and -2.5 SD Osteoporosis T-score at or below -2.5 SD RECOMMENDATION: 1. All patients should optimize calcium and vitamin D intake. 2. Consider FDA-approved medical therapies in postmenopausal women and men aged 90 years and older, based on the following: a. A hip or vertebral (clinical or morphometric) fracture. b. T-score = -2.5 at the femoral neck or spine after appropriate evaluation to exclude secondary causes. c. Low bone mass (T-score between -1.0 and -2.5 at the femoral neck or spine) and a 10-year probability of a hip fracture = 3% or a 10-year probability of a major osteoporosis-related fracture = 20% based on the [place]-adapted WHO algorithm. d. Clinician judgment and/or patient preferences may indicate treatment for people with 10-year fracture probabilities above or below these levels. FOLLOW-UP: Patients with diagnosis of osteoporosis or at high risk for fracture should have regular bone mineral density tests. Patients eligible for Medicare are allowed routine testing every 2 years. The testing frequency can be increased to one year for patients who have rapidly  progressing disease, are receiving or discontinuing medical therapy to restore bone mass, or have additional risk factors. I have reviewed this study and agree with the findings. [facility] Electronically Signed   By: Frederico Hamman M.D.   On: 04/30/2023 12:27      Subjective: Patient seen and examined at bedside today.  Hemodynamically stable.  She was alert and oriented during my evaluation.  Daughter at the bedside.  She was on 2 L of oxygen which was weaned successfully this afternoon.  Patient expressed her desire to go home.  Called the daughter again and discussed about discharge planning.  Discharge Exam: Vitals:   05/09/23 0852 05/09/23 1503  BP: (!) 148/90 (!) 145/88  Pulse: 71 84  Resp: 20 20  Temp: 98.9 F (37.2 C) 97.8 F (36.6 C)  SpO2: 100% 94%   Vitals:   05/08/23 2035 05/09/23 0553 05/09/23 0852 05/09/23 1503  BP: (!) 166/101 (!) 163/95 (!) 148/90 (!) 145/88  Pulse: 81 81 71 84  Resp: 20 20 20 20   Temp: 98.6 F (37 C) (!) 100.4 F (38 C) 98.9 F (37.2 C) 97.8 F (36.6 C)  TempSrc: Oral Oral Oral Oral  SpO2: 94% 100% 100% 94%  Weight:      Height:        General: Pt is alert, awake, not in acute distress Cardiovascular: RRR, S1/S2 +, no rubs, no gallops Respiratory: CTA bilaterally, no wheezing, no rhonchi Abdominal: Soft, NT, ND, bowel sounds + Extremities: no edema, no cyanosis    The results of significant diagnostics from this hospitalization (including imaging, microbiology, ancillary and laboratory) are listed below for reference.     Microbiology: Recent Results (from the past 240 hour(s))  Blood Culture (routine x 2)     Status: None (Preliminary result)   Collection Time: 05/08/23 11:06 AM   Specimen: BLOOD  Result Value Ref Range Status   Specimen Description BLOOD RIGHT ANTECUBITAL  Final   Special Requests   Final    BOTTLES DRAWN AEROBIC AND ANAEROBIC Blood Culture results may not be optimal due to an excessive  volume of blood received in culture bottles   Culture   Final    NO GROWTH < 24 HOURS Performed at [facility], [address], [place], [place] [address]    Report Status PENDING  Incomplete  Blood Culture (routine x 2)     Status: None (Preliminary result)   Collection Time: 05/08/23 11:11 AM   Specimen: BLOOD  Result Value Ref Range Status   Specimen Description BLOOD SITE NOT SPECIFIED  Final   Special Requests   Final    BOTTLES DRAWN AEROBIC AND ANAEROBIC Blood Culture adequate volume   Culture   Final    NO GROWTH < 24 HOURS Performed at [facility], [address] [place], [place] [address]    Report Status PENDING  Incomplete     Labs: BNP (last 3 results) No results for input(s): "BNP" in the last 8760 hours. Basic Metabolic Panel: Recent Labs  Lab 05/08/23 1134 05/09/23 0302  NA 136 138  K 3.5 3.8  CL 98 101  CO2 23 23  GLUCOSE 146* 155*  BUN 12 14  CREATININE 1.06* 1.06*  CALCIUM 9.0 8.7*   Liver Function Tests: Recent Labs  Lab 05/08/23 1134 05/09/23 0302  AST 15 13*  ALT 11 12  ALKPHOS 103 84  BILITOT 1.3* 0.7  PROT 7.9 7.1  ALBUMIN 4.5 3.8   No results for input(s): "LIPASE", "AMYLASE" in the last 168 hours. No results for input(s): "AMMONIA" in the last 168 hours. CBC: Recent Labs  Lab 05/08/23 1134 05/09/23 0302  WBC 20.2* 15.3*  NEUTROABS 18.6*  --   HGB 12.2 11.6*  HCT 37.6 35.4*  MCV 94.7 94.1  PLT 207 199   Cardiac Enzymes: No results for input(s): "CKTOTAL", "CKMB", "CKMBINDEX", "TROPONINI" in the last 168 hours. BNP: Invalid input(s): "POCBNP" CBG: No results for input(s): "GLUCAP" in the last 168 hours. D-Dimer Recent Labs    05/08/23 1134  DDIMER 0.66*   Hgb A1c No results for input(s): "HGBA1C" in the last 72 hours. Lipid Profile No results for input(s): "CHOL", "HDL", "LDLCALC", "TRIG", "CHOLHDL", "LDLDIRECT" in the last 72 hours. Thyroid function studies No results for input(s): "TSH",  "T4TOTAL", "T3FREE", "THYROIDAB" in the last 72 hours.  Invalid input(s): "FREET3" Anemia work up No results for input(s): "VITAMINB12", "FOLATE", "FERRITIN", "TIBC", "IRON", "RETICCTPCT" in the last 72 hours. Urinalysis    Component Value Date/Time   COLORURINE YELLOW 05/08/2023 1500   APPEARANCEUR CLEAR 05/08/2023 1500   APPEARANCEUR Cloudy (A) 01/14/2023 1343   LABSPEC 1.015 05/08/2023 1500   PHURINE 6.0 05/08/2023 1500   GLUCOSEU NEGATIVE 05/08/2023 1500   HGBUR NEGATIVE 05/08/2023 1500   BILIRUBINUR NEGATIVE 05/08/2023 1500   BILIRUBINUR Negative 01/14/2023 1343   KETONESUR 5 (A) 05/08/2023 1500   PROTEINUR 100 (A) 05/08/2023 1500   NITRITE NEGATIVE 05/08/2023 1500   LEUKOCYTESUR NEGATIVE 05/08/2023 1500   Sepsis Labs Recent Labs  Lab 05/08/23 1134 05/09/23 0302  WBC 20.2* 15.3*   Microbiology Recent Results (from the past 240 hour(s))  Blood Culture (routine x 2)     Status: None (Preliminary result)   Collection Time: 05/08/23 11:06 AM   Specimen: BLOOD  Result Value Ref Range Status   Specimen Description BLOOD RIGHT ANTECUBITAL  Final   Special Requests   Final    BOTTLES DRAWN AEROBIC AND ANAEROBIC Blood Culture results may not be optimal due to an excessive volume of blood received in culture bottles   Culture   Final    NO GROWTH < 24 HOURS Performed at [facility], [address], [place], [place] [address]    Report Status PENDING  Incomplete  Blood Culture (routine x 2)     Status: None (Preliminary result)  Collection Time: 05/08/23 11:11 AM   Specimen: BLOOD  Result Value Ref Range Status   Specimen Description BLOOD SITE NOT SPECIFIED  Final   Special Requests   Final    BOTTLES DRAWN AEROBIC AND ANAEROBIC Blood Culture adequate volume   Culture   Final    NO GROWTH < 24 HOURS Performed at [facility], [address] [place], [place] [address]    Report Status PENDING  Incomplete    Please note: You were cared for by a  hospitalist during your hospital stay. Once you are discharged, your primary care physician will handle any further medical issues. Please note that NO REFILLS for any discharge medications will be authorized once you are discharged, as it is imperative that you return to your primary care physician (or establish a relationship with a primary care physician if you do not have one) for your post hospital discharge needs so that they can reassess your need for medications and monitor your lab values.    Time coordinating discharge: 40 minutes  SIGNED:   Burnadette Pop, MD  [facility] 05/09/2023, 3:52 PM Pager [phone]  If 7PM-7AM, please contact night-coverage [email or web address] Password [email or web address]

## 2023-05-09 NOTE — Progress Notes (Signed)
PROGRESS NOTE  Shannon Hart  ZOX:[id number] DOB: 1951/11/12 DOA: 05/08/2023 PCP: Gillis Santa, NP   Brief Narrative:  Shannon Hart is a 72 y.o. female with medical history significant of dementia, hypothyroidism, hypertension, hyperlipidemia, traumatic brain injury who was brought by her family to the Emergency Department for the evaluation increased confusion from baseline, lethargy.  She was diagnosed with COVID about 7 days ago.  Patient has been being lethargic, more confused from baseline.  She was having poor oral intake and was not taking her medications.  She was found to be hypoxic by EMS.  She was febrile on presentation.  COVID test, to be positive.  Chest x-ray did not show pneumonia.  Patient was found to be confused. Overall status getting significantly better.  She is alert and oriented this morning.  Trying to wean the oxygen.  Plan for discharge tomorrow to home if remains stable  Assessment & Plan:  Principal Problem:   COVID-19 Active Problems:   Memory loss   COVID-19 virus infection   Fever   CAP (community acquired pneumonia)   Acute hypoxic respiratory failure: Secondary to COVID infection.  Was found to be hypoxic, wheezing when EMS arrived.  Currently saturating fine on 2 L of oxygen.We will wean   COVID illness: Diagnosed with COVID about 7 days ago.  Presented here with lethargy, fever, hypoxia. Continue supportive care.  Started on steroid.  No indication for starting on antiviral for now. Continue to monitor inflammatory markers. Patient was febrile on presentation.  This is most likely from COVID.  X-ray of the chest did not show any pneumonia.  But empirically started on antibiotics due to  high-grade fever.negative procalcitoninin.Has leucocytosis but improving now.Blood cultures/urine culture have been sent:NGTD   Acute metabolic encephalopathy: Has been more confused since 1 to 2 days.  History of mild dementia/TBI.  CT scan did not show any acute  intracranial abnormality.  MRI of the brain did not show any acute findings.  Mental status has improved today and she is alert and oriented  History of bipolar 1 disorder: On lamotrigine   History of depression: Trazodone,venlafaxine   Hyperlipidemia: Lipitor   Hypertension: Takes lisinopril at home, restarted.  Monitor blood pressure.   History of  dementia/TBI: Continue Aricept.  Delirium precautions.  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  Follows with ENT.  Has an appointment with ENT soon   Hypothyroidism: Continue Synthroid          DVT prophylaxis:enoxaparin (LOVENOX) injection 40 mg Start: 05/08/23 1445     Code Status: Full Code  Family Communication: daughter present at bedside  Patient status:Inpatient  Patient is from :home  Anticipated discharge WJ:XBJY  Estimated DC date:tomorrow   Consultants: None  Procedures: None  Antimicrobials:  Anti-infectives (From admission, onward)    Start     Dose/Rate Route Frequency Ordered Stop   05/09/23 1000  azithromycin (ZITHROMAX) tablet 500 mg        500 mg Oral Daily 05/08/23 1510     05/09/23 1000  cefTRIAXone (ROCEPHIN) 2 g in sodium chloride 0.9 % 100 mL IVPB        2 g 200 mL/hr over 30 Minutes Intravenous Every 24 hours 05/08/23 1510     05/08/23 1515  cefTRIAXone (ROCEPHIN) 1 g in sodium chloride 0.9 % 100 mL IVPB        1 g 200 mL/hr over 30 Minutes Intravenous  Once 05/08/23 1510     05/08/23 1300  cefTRIAXone (  ROCEPHIN) 1 g in sodium chloride 0.9 % 100 mL IVPB        1 g 200 mL/hr over 30 Minutes Intravenous  Once 05/08/23 1255 05/08/23 1450   05/08/23 1300  azithromycin (ZITHROMAX) 500 mg in sodium chloride 0.9 % 250 mL IVPB        500 mg 250 mL/hr over 60 Minutes Intravenous  Once 05/08/23 1255 05/08/23 1451       Subjective: Patient seen and examined at bedside today.  Hemodynamically stable.  She looks much better today.  She is alert and oriented.  She had a fever of 100.4 early this  morning.  Denies any worsening shortness of breath or cough.  Complains of neck pain, headache  Objective: Vitals:   05/08/23 1800 05/08/23 2035 05/09/23 0553 05/09/23 0852  BP: (!) 170/91 (!) 166/101 (!) 163/95 (!) 148/90  Pulse: 85 81 81 71  Resp: (!) 28 20 20 20   Temp: 98.4 F (36.9 C) 98.6 F (37 C) (!) 100.4 F (38 C) 98.9 F (37.2 C)  TempSrc: Oral Oral Oral Oral  SpO2: 99% 94% 100% 100%  Weight:      Height:        Intake/Output Summary (Last 24 hours) at 05/09/2023 1154 Last data filed at 05/08/2023 2100 Gross per 24 hour  Intake 260.41 ml  Output 0 ml  Net 260.41 ml   Filed Weights   05/08/23 1140  Weight: 70.8 kg    Examination:  General exam: Overall comfortable, not in distress HEENT: PERRL Respiratory system:  no wheezes or crackles  Cardiovascular system: S1 & S2 heard, RRR.  Gastrointestinal system: Abdomen is nondistended, soft and nontender. Central nervous system: Alert and oriented Extremities: No edema, no clubbing ,no cyanosis Skin: No rashes, no ulcers,no icterus     Data Reviewed: I have personally reviewed following labs and imaging studies  CBC: Recent Labs  Lab 05/08/23 1134 05/09/23 0302  WBC 20.2* 15.3*  NEUTROABS 18.6*  --   HGB 12.2 11.6*  HCT 37.6 35.4*  MCV 94.7 94.1  PLT 207 199   Basic Metabolic Panel: Recent Labs  Lab 05/08/23 1134 05/09/23 0302  NA 136 138  K 3.5 3.8  CL 98 101  CO2 23 23  GLUCOSE 146* 155*  BUN 12 14  CREATININE 1.06* 1.06*  CALCIUM 9.0 8.7*     Recent Results (from the past 240 hour(s))  Blood Culture (routine x 2)     Status: None (Preliminary result)   Collection Time: 05/08/23 11:06 AM   Specimen: BLOOD  Result Value Ref Range Status   Specimen Description BLOOD RIGHT ANTECUBITAL  Final   Special Requests   Final    BOTTLES DRAWN AEROBIC AND ANAEROBIC Blood Culture results may not be optimal due to an excessive volume of blood received in culture bottles   Culture   Final    NO  GROWTH < 24 HOURS Performed at [facility], [address] [place], [place] [address]    Report Status PENDING  Incomplete  Blood Culture (routine x 2)     Status: None (Preliminary result)   Collection Time: 05/08/23 11:11 AM   Specimen: BLOOD  Result Value Ref Range Status   Specimen Description BLOOD SITE NOT SPECIFIED  Final   Special Requests   Final    BOTTLES DRAWN AEROBIC AND ANAEROBIC Blood Culture adequate volume   Culture   Final    NO GROWTH < 24 HOURS Performed at [facility], [address], [place], [place] [address]    Report Status PENDING  Incomplete     Radiology Studies: MR BRAIN WO CONTRAST  Result Date: 05/09/2023 CLINICAL DATA:  Delirium EXAM: MRI HEAD WITHOUT CONTRAST TECHNIQUE: Multiplanar, multiecho pulse sequences of the brain and surrounding structures were obtained without intravenous contrast. COMPARISON:  No prior MRI available, correlation is made with CT head 05/08/2023 FINDINGS: Brain: No restricted diffusion to suggest acute or subacute infarct. No acute hemorrhage, mass, mass effect, or midline shift. No hydrocephalus or extra-axial collection. Empty sella, with expansion of the sella. Normal craniocervical junction. No hemosiderin deposition to suggest remote hemorrhage. Mildly advanced cerebral volume loss for age. Vascular: Normal arterial flow voids. Skull and upper cervical spine: Normal marrow signal. Sinuses/Orbits: Postsurgical changes in the sinuses. Mild mucosal thickening throughout the paranasal sinuses. Air-fluid level in the remaining sphenoid sinuses. Status post bilateral lens replacements. Other: Fluid in right mastoid air cells. IMPRESSION: 1. No evidence of acute or subacute infarct. 2. Air-fluid level in the sphenoid sinuses, which is nonspecific but can be seen in the setting of acute sinusitis. Correlate with symptoms. Electronically Signed   By: Wiliam Ke M.D.   On: 05/09/2023 00:18   CT Head Wo Contrast  Result  Date: 05/08/2023 CLINICAL DATA:  Headache, new onset (Age >= 51y) EXAM: CT HEAD WITHOUT CONTRAST TECHNIQUE: Contiguous axial images were obtained from the base of the skull through the vertex without intravenous contrast. RADIATION DOSE REDUCTION: This exam was performed according to the departmental dose-optimization program which includes automated exposure control, adjustment of the mA and/or kV according to patient size and/or use of iterative reconstruction technique. COMPARISON:  None Available. FINDINGS: Brain: No evidence of acute infarction, hemorrhage, hydrocephalus, extra-axial collection or mass lesion/mass effect. Vascular: No hyperdense vessel or unexpected calcification. Skull: Normal. Negative for fracture or focal lesion. Sinuses/Orbits: No middle ear or mastoid effusion. Postsurgical changes from prior endoscopic sinus surgery with a left maxillary antrostomy and inferior ethmoidectomy, as well as sphenoidotomies. Bilateral lens replacement. Orbits are otherwise unremarkable. Other: None. IMPRESSION: No acute intracranial abnormality. Electronically Signed   By: Lorenza Cambridge M.D.   On: 05/08/2023 13:25   DG Chest Port 1 View  Result Date: 05/08/2023 CLINICAL DATA:  Altered mental status and hypoxia EXAM: PORTABLE CHEST 1 VIEW COMPARISON:  Chest radiograph dated 12/14/2020, CT chest dated 01/11/2023 FINDINGS: Normal lung volumes. Right lower lung nodule, better evaluated on prior chest CT. No pleural effusion or pneumothorax. The heart size and mediastinal contours are within normal limits. Partially imaged right shoulder arthroplasty. IMPRESSION: 1. No acute cardiopulmonary process. 2. Right lower lung nodule, better evaluated on prior chest CT. Electronically Signed   By: Agustin Cree M.D.   On: 05/08/2023 11:57    Scheduled Meds:  atorvastatin  40 mg Oral Daily   azithromycin  500 mg Oral Daily   donepezil  10 mg Oral QHS   enoxaparin (LOVENOX) injection  40 mg Subcutaneous Q24H    ferrous sulfate  325 mg Oral Q breakfast   lamoTRIgine  200 mg Oral Daily   levothyroxine  75 mcg Oral Q0600   lisinopril  40 mg Oral Daily   melatonin  3 mg Oral QHS   methylPREDNISolone (SOLU-MEDROL) injection  80 mg Intravenous Daily   mirabegron ER  50 mg Oral Daily   traZODone  50 mg Oral QHS   venlafaxine XR  150 mg Oral Q breakfast   Continuous Infusions:  sodium chloride 75 mL/hr  at 05/09/23 0548   cefTRIAXone (ROCEPHIN)  IV     cefTRIAXone (ROCEPHIN)  IV 2 g (05/09/23 0857)     LOS: 1 day   Burnadette Pop, MD [facility] P5/30/2024, 11:54 AM

## 2023-05-10 LAB — CULTURE, BLOOD (ROUTINE X 2)

## 2023-05-11 LAB — CULTURE, BLOOD (ROUTINE X 2)

## 2023-05-12 ENCOUNTER — Encounter: Payer: Self-pay | Admitting: Adult Health

## 2023-05-12 LAB — CULTURE, BLOOD (ROUTINE X 2): Special Requests: ADEQUATE

## 2023-05-13 ENCOUNTER — Other Ambulatory Visit: Payer: Medicare Other | Admitting: Urology

## 2023-05-13 LAB — CULTURE, BLOOD (ROUTINE X 2): Culture: NO GROWTH

## 2023-05-13 MED ORDER — BUTALBITAL-APAP-CAFFEINE 50-325-40 MG PO TABS: 2.0000 | ORAL_TABLET | ORAL | 0 refills | Status: AC | PRN

## 2023-05-25 ENCOUNTER — Encounter: Payer: Self-pay | Admitting: Adult Health

## 2023-06-07 ENCOUNTER — Telehealth: Payer: Self-pay | Admitting: *Deleted

## 2023-06-07 NOTE — Telephone Encounter (Signed)
Patient sent MyChart message regarding Prolia Authorization.   No Prolia Request was received for this patient.  Patient had an appointment on 05/02/2023 and Prolia was discussed.   Verification for Prolia was submitted to [employer].

## 2023-06-07 NOTE — Telephone Encounter (Signed)
I never received a Prolia Request for this patient. No Prolia Form nor message was sent to get patient Verified for Prolia.  Will send to [employer] for Verification.

## 2023-06-10 ENCOUNTER — Encounter: Payer: Self-pay | Admitting: Adult Health

## 2023-06-10 ENCOUNTER — Ambulatory Visit (INDEPENDENT_AMBULATORY_CARE_PROVIDER_SITE_OTHER): Payer: Medicare Other | Admitting: Adult Health

## 2023-06-10 VITALS — BP 138/86 | HR 75 | Temp 97.3°F | Resp 18 | Ht 63.0 in | Wt 154.0 lb

## 2023-06-10 DIAGNOSIS — R413 Other amnesia: Secondary | ICD-10-CM

## 2023-06-10 DIAGNOSIS — E890 Postprocedural hypothyroidism: Secondary | ICD-10-CM

## 2023-06-10 DIAGNOSIS — I10 Essential (primary) hypertension: Secondary | ICD-10-CM

## 2023-06-10 DIAGNOSIS — F319 Bipolar disorder, unspecified: Secondary | ICD-10-CM

## 2023-06-10 DIAGNOSIS — Z720 Tobacco use: Secondary | ICD-10-CM

## 2023-06-10 DIAGNOSIS — G9600 Cerebrospinal fluid leak, unspecified: Secondary | ICD-10-CM | POA: Diagnosis not present

## 2023-06-10 MED ORDER — NICOTINE 14 MG/24HR TD PT24
14.0000 mg | MEDICATED_PATCH | Freq: Every day | TRANSDERMAL | 0 refills | Status: DC
Start: 2023-06-10 — End: 2023-11-11

## 2023-06-10 NOTE — Progress Notes (Unsigned)
[facility] clinic  Provider:   Code Status:    Goals of Care:     05/08/2023   11:43 AM  Advanced Directives  Does Patient Have a Medical Advance Directive? No  Would patient like information on creating a medical advance directive? No - Guardian declined     Chief Complaint  Patient presents with   Medical Management of Chronic Issues    Patient is here to get medical clearance    HPI: Patient is a 72 y.o. female seen today for an acute visit for  With daughter  Past Medical History:  Diagnosis Date   Allergy    Alzheimer disease (HCC)    Anxiety    Arthritis    Bipolar 1 disorder (HCC)    Cancer (HCC)    Cataract    CSF leak    Depression    Hyperlipidemia    Hypertension    Neuromuscular disorder (HCC)    Sleep apnea    TBI (traumatic brain injury) (HCC)    Thyroid disease    TIA (transient ischemic attack)    Vitamin B6 deficiency    Vitamin D deficiency     Past Surgical History:  Procedure Laterality Date   ABDOMINAL HYSTERECTOMY     DG SHOULDER RIGHT COMPLETE  2023   RECTOVAGINAL FISTULA CLOSURE     ROTATOR CUFF REPAIR     THYROIDECTOMY  2004    Allergies  Allergen Reactions   Latex Rash   Other Rash    Tape made her have blisters   Wound Dressing Adhesive Rash    Outpatient Encounter Medications as of 06/10/2023  Medication Sig   acetaminophen (TYLENOL) 650 MG CR tablet Take 1,300 mg by mouth every 8 (eight) hours as needed for pain.   albuterol (VENTOLIN HFA) 108 (90 Base) MCG/ACT inhaler Inhale 2 puffs into the lungs every 6 (six) hours as needed for wheezing or shortness of breath.   ascorbic acid (VITAMIN C) 500 MG tablet Take 500 mg by mouth 2 (two) times daily.   atorvastatin (LIPITOR) 40 MG tablet Take 1 tablet (40 mg total) by mouth daily.   butalbital-acetaminophen-caffeine (FIORICET) 50-325-40 MG tablet Take 2 tablets by mouth as needed for headache.   denosumab (PROLIA) 60 MG/ML SOSY injection Inject 60 mg into the skin every 6  (six) months.   donepezil (ARICEPT) 10 MG tablet Take 1 tablet (10 mg total) by mouth at bedtime.   ferrous sulfate 325 (65 FE) MG tablet Take 1 tablet (325 mg total) by mouth daily.   lamoTRIgine (LAMICTAL) 200 MG tablet Take 1 tablet (200 mg total) by mouth daily.   levothyroxine (SYNTHROID) 75 MCG tablet Take 1 tablet (75 mcg total) by mouth daily.   lisinopril (ZESTRIL) 40 MG tablet Take 1 tablet (40 mg total) by mouth daily.   loratadine (CLARITIN) 10 MG tablet Take 10 mg by mouth daily.   melatonin 3 MG TABS tablet Take 3 mg by mouth at bedtime.   mirabegron ER (MYRBETRIQ) 50 MG TB24 tablet Take 50 mg by mouth daily.   Omega-3 Fatty Acids (FISH OIL) 1000 MG CAPS Take 1 capsule by mouth daily.   pregabalin (LYRICA) 50 MG capsule Take 1 capsule by mouth twice daily.   Rimegepant Sulfate (NURTEC) 75 MG TBDP Place 1 tablet under the tongue every other day.   traZODone (DESYREL) 50 MG tablet Take 1 tablet (50 mg total) by mouth at bedtime.   trospium (SANCTURA) 20 MG tablet Take 20 mg  by mouth 2 (two) times daily.   venlafaxine XR (EFFEXOR-XR) 150 MG 24 hr capsule Take 1 capsule (150 mg total) by mouth daily with breakfast.   No facility-administered encounter medications on file as of 06/10/2023.    Review of Systems:  Review of Systems  Constitutional:  Negative for appetite change, chills, fatigue and fever.  HENT:  Negative for congestion, hearing loss, rhinorrhea and sore throat.   Eyes: Negative.   Respiratory:  Negative for cough, shortness of breath and wheezing.   Cardiovascular:  Negative for chest pain, palpitations and leg swelling.  Gastrointestinal:  Negative for abdominal pain, constipation, diarrhea, nausea and vomiting.  Genitourinary:  Negative for dysuria.  Musculoskeletal:  Negative for arthralgias, back pain and myalgias.  Skin:  Negative for color change, rash and wound.  Neurological:  Negative for dizziness, weakness and headaches.  Psychiatric/Behavioral:   Negative for behavioral problems. The patient is not nervous/anxious.     Health Maintenance  Topic Date Due   COVID-19 Vaccine (6 - 2023-24 season) 02/11/2023   INFLUENZA VACCINE  07/11/2023   Lung Cancer Screening  01/12/2024   Colonoscopy  02/18/2024   Medicare Annual Wellness (AWV)  03/06/2024   MAMMOGRAM  03/26/2025   DTaP/Tdap/Td (4 - Td or Tdap) 03/14/2032   Pneumonia Vaccine 63+ Years old  Completed   DEXA SCAN  Completed   Hepatitis C Screening  Completed   Zoster Vaccines- Shingrix  Completed   HPV VACCINES  Aged Out    Physical Exam: Vitals:   06/10/23 1456  BP: 138/86  Pulse: 75  Resp: 18  Temp: (!) 97.3 F (36.3 C)  SpO2: 94%  Weight: 154 lb (69.9 kg)  Height: 5\' 3"  (1.6 m)   Body mass index is 27.28 kg/m. Physical Exam Constitutional:      Appearance: Normal appearance.  HENT:     Head: Normocephalic and atraumatic.     Nose: Nose normal.     Mouth/Throat:     Mouth: Mucous membranes are moist.  Eyes:     Conjunctiva/sclera: Conjunctivae normal.  Cardiovascular:     Rate and Rhythm: Normal rate and regular rhythm.  Pulmonary:     Effort: Pulmonary effort is normal.     Breath sounds: Normal breath sounds.  Abdominal:     General: Bowel sounds are normal.     Palpations: Abdomen is soft.  Musculoskeletal:        General: Normal range of motion.     Cervical back: Normal range of motion.  Skin:    General: Skin is warm and dry.  Neurological:     General: No focal deficit present.     Mental Status: She is alert and oriented to person, place, and time.  Psychiatric:        Mood and Affect: Mood normal.        Behavior: Behavior normal.        Thought Content: Thought content normal.        Judgment: Judgment normal.     Labs reviewed: Basic Metabolic Panel: Recent Labs    11/09/22 1418 03/28/23 1541 05/08/23 1134 05/09/23 0302  NA 143 142 136 138  K 3.9 4.0 3.5 3.8  CL 104 105 98 101  CO2 29 28 23 23   GLUCOSE 87 84 146* 155*   BUN 14 17 12 14   CREATININE 0.92 0.96 1.06* 1.06*  CALCIUM 9.2 8.9 9.0 8.7*  TSH 0.14* 0.42  --   --    Liver Function  Tests: Recent Labs    03/28/23 1541 05/08/23 1134 05/09/23 0302  AST 11 15 13*  ALT 8 11 12   ALKPHOS  --  103 84  BILITOT 0.4 1.3* 0.7  PROT 6.9 7.9 7.1  ALBUMIN  --  4.5 3.8   No results for input(s): "LIPASE", "AMYLASE" in the last 8760 hours. No results for input(s): "AMMONIA" in the last 8760 hours. CBC: Recent Labs    11/09/22 1418 03/28/23 1541 05/08/23 1134 05/09/23 0302  WBC 7.0 10.1 20.2* 15.3*  NEUTROABS 4,473 7,312 18.6*  --   HGB 11.7 12.4 12.2 11.6*  HCT 33.9* 36.3 37.6 35.4*  MCV 92.6 91.7 94.7 94.1  PLT 211 183 207 199   Lipid Panel: No results for input(s): "CHOL", "HDL", "LDLCALC", "TRIG", "CHOLHDL", "LDLDIRECT" in the last 8760 hours. No results found for: "HGBA1C"  Procedures since last visit: No results found.  Assessment/Plan    Labs/tests ordered:    Next appt:  Visit date not found

## 2023-06-11 NOTE — Progress Notes (Incomplete)
[facility] clinic  Provider:  Kenard Gower DNP  Code Status:  Full Code  Goals of Care:     05/08/2023   11:43 AM  Advanced Directives  Does Patient Have a Medical Advance Directive? No  Would patient like information on creating a medical advance directive? No - Guardian declined     Chief Complaint  Patient presents with  . Medical Management of Chronic Issues    Patient is here to get medical clearance    HPI: Patient is a 72 y.o. female seen today for surgical clearance. She was accompanied by her daughter.  CSF leak  Tobacco use  Postoperative hypothyroidism  Bipolar 1 disorder (HCC)  Memory loss   Past Medical History:  Diagnosis Date  . Allergy   . Alzheimer disease (HCC)   . Anxiety   . Arthritis   . Bipolar 1 disorder (HCC)   . Cancer (HCC)   . Cataract   . CSF leak   . Depression   . Hyperlipidemia   . Hypertension   . Neuromuscular disorder (HCC)   . Sleep apnea   . TBI (traumatic brain injury) (HCC)   . Thyroid disease   . TIA (transient ischemic attack)   . Vitamin B6 deficiency   . Vitamin D deficiency     Past Surgical History:  Procedure Laterality Date  . ABDOMINAL HYSTERECTOMY    . DG SHOULDER RIGHT COMPLETE  2023  . RECTOVAGINAL FISTULA CLOSURE    . ROTATOR CUFF REPAIR    . THYROIDECTOMY  2004    Allergies  Allergen Reactions  . Latex Rash  . Other Rash    Tape made her have blisters  . Wound Dressing Adhesive Rash    Outpatient Encounter Medications as of 06/10/2023  Medication Sig  . acetaminophen (TYLENOL) 650 MG CR tablet Take 1,300 mg by mouth every 8 (eight) hours as needed for pain.  Marland Kitchen albuterol (VENTOLIN HFA) 108 (90 Base) MCG/ACT inhaler Inhale 2 puffs into the lungs every 6 (six) hours as needed for wheezing or shortness of breath.  Marland Kitchen ascorbic acid (VITAMIN C) 500 MG tablet Take 500 mg by mouth 2 (two) times daily.  Marland Kitchen atorvastatin (LIPITOR) 40 MG tablet Take 1 tablet (40 mg total) by mouth daily.  .  butalbital-acetaminophen-caffeine (FIORICET) 50-325-40 MG tablet Take 2 tablets by mouth as needed for headache.  . denosumab (PROLIA) 60 MG/ML SOSY injection Inject 60 mg into the skin every 6 (six) months.  . donepezil (ARICEPT) 10 MG tablet Take 1 tablet (10 mg total) by mouth at bedtime.  . ferrous sulfate 325 (65 FE) MG tablet Take 1 tablet (325 mg total) by mouth daily.  Marland Kitchen lamoTRIgine (LAMICTAL) 200 MG tablet Take 1 tablet (200 mg total) by mouth daily.  Marland Kitchen levothyroxine (SYNTHROID) 75 MCG tablet Take 1 tablet (75 mcg total) by mouth daily.  Marland Kitchen lisinopril (ZESTRIL) 40 MG tablet Take 1 tablet (40 mg total) by mouth daily.  Marland Kitchen loratadine (CLARITIN) 10 MG tablet Take 10 mg by mouth daily.  . melatonin 3 MG TABS tablet Take 3 mg by mouth at bedtime.  . mirabegron ER (MYRBETRIQ) 50 MG TB24 tablet Take 50 mg by mouth daily.  . Omega-3 Fatty Acids (FISH OIL) 1000 MG CAPS Take 1 capsule by mouth daily.  . pregabalin (LYRICA) 50 MG capsule Take 1 capsule by mouth twice daily.  . Rimegepant Sulfate (NURTEC) 75 MG TBDP Place 1 tablet under the tongue every other day.  . traZODone (DESYREL)  50 MG tablet Take 1 tablet (50 mg total) by mouth at bedtime.  . trospium (SANCTURA) 20 MG tablet Take 20 mg by mouth 2 (two) times daily.  Marland Kitchen venlafaxine XR (EFFEXOR-XR) 150 MG 24 hr capsule Take 1 capsule (150 mg total) by mouth daily with breakfast.   No facility-administered encounter medications on file as of 06/10/2023.    Review of Systems:  Review of Systems  Constitutional:  Negative for appetite change, chills, fatigue and fever.  HENT:  Negative for congestion, hearing loss, rhinorrhea and sore throat.   Eyes: Negative.   Respiratory:  Negative for cough, shortness of breath and wheezing.   Cardiovascular:  Negative for chest pain, palpitations and leg swelling.  Gastrointestinal:  Negative for abdominal pain, constipation, diarrhea, nausea and vomiting.  Genitourinary:  Negative for dysuria.   Musculoskeletal:  Negative for arthralgias, back pain and myalgias.  Skin:  Negative for color change, rash and wound.  Neurological:  Negative for dizziness, weakness and headaches.  Psychiatric/Behavioral:  Negative for behavioral problems. The patient is not nervous/anxious.     Health Maintenance  Topic Date Due  . COVID-19 Vaccine (6 - 2023-24 season) 02/11/2023  . INFLUENZA VACCINE  07/11/2023  . Lung Cancer Screening  01/12/2024  . Colonoscopy  02/18/2024  . Medicare Annual Wellness (AWV)  03/06/2024  . MAMMOGRAM  03/26/2025  . DTaP/Tdap/Td (4 - Td or Tdap) 03/14/2032  . Pneumonia Vaccine 54+ Years old  Completed  . DEXA SCAN  Completed  . Hepatitis C Screening  Completed  . Zoster Vaccines- Shingrix  Completed  . HPV VACCINES  Aged Out    Physical Exam: Vitals:   06/10/23 1456  BP: 138/86  Pulse: 75  Resp: 18  Temp: (!) 97.3 F (36.3 C)  SpO2: 94%  Weight: 154 lb (69.9 kg)  Height: 5\' 3"  (1.6 m)   Body mass index is 27.28 kg/m. Physical Exam Constitutional:      Appearance: Normal appearance.  HENT:     Head: Normocephalic and atraumatic.     Nose: Nose normal.     Mouth/Throat:     Mouth: Mucous membranes are moist.  Eyes:     Conjunctiva/sclera: Conjunctivae normal.  Cardiovascular:     Rate and Rhythm: Normal rate and regular rhythm.  Pulmonary:     Effort: Pulmonary effort is normal.     Breath sounds: Normal breath sounds.  Abdominal:     General: Bowel sounds are normal.     Palpations: Abdomen is soft.  Musculoskeletal:        General: Normal range of motion.     Cervical back: Normal range of motion.  Skin:    General: Skin is warm and dry.  Neurological:     General: No focal deficit present.     Mental Status: She is alert and oriented to person, place, and time.  Psychiatric:        Mood and Affect: Mood normal.        Behavior: Behavior normal.        Thought Content: Thought content normal.        Judgment: Judgment normal.      Labs reviewed: Basic Metabolic Panel: Recent Labs    11/09/22 1418 03/28/23 1541 05/08/23 1134 05/09/23 0302  NA 143 142 136 138  K 3.9 4.0 3.5 3.8  CL 104 105 98 101  CO2 29 28 23 23   GLUCOSE 87 84 146* 155*  BUN 14 17 12 14   CREATININE 0.92  0.96 1.06* 1.06*  CALCIUM 9.2 8.9 9.0 8.7*  TSH 0.14* 0.42  --   --    Liver Function Tests: Recent Labs    03/28/23 1541 05/08/23 1134 05/09/23 0302  AST 11 15 13*  ALT 8 11 12   ALKPHOS  --  103 84  BILITOT 0.4 1.3* 0.7  PROT 6.9 7.9 7.1  ALBUMIN  --  4.5 3.8   No results for input(s): "LIPASE", "AMYLASE" in the last 8760 hours. No results for input(s): "AMMONIA" in the last 8760 hours. CBC: Recent Labs    11/09/22 1418 03/28/23 1541 05/08/23 1134 05/09/23 0302  WBC 7.0 10.1 20.2* 15.3*  NEUTROABS 4,473 7,312 18.6*  --   HGB 11.7 12.4 12.2 11.6*  HCT 33.9* 36.3 37.6 35.4*  MCV 92.6 91.7 94.7 94.1  PLT 211 183 207 199   Lipid Panel: No results for input(s): "CHOL", "HDL", "LDLCALC", "TRIG", "CHOLHDL", "LDLDIRECT" in the last 8760 hours. No results found for: "HGBA1C"  Procedures since last visit: No results found.  Assessment/Plan    Labs/tests ordered:    Next appt:  Visit date not found

## 2023-06-17 ENCOUNTER — Ambulatory Visit (HOSPITAL_COMMUNITY): Payer: Medicare Other | Admitting: Psychiatry

## 2023-06-17 ENCOUNTER — Other Ambulatory Visit: Payer: Medicare Other | Admitting: Urology

## 2023-06-17 NOTE — Progress Notes (Deleted)
BH MD/PA/NP OP Progress Note  06/17/2023 8:48 AM Shannon Hart  MRN:  [id number]  Visit Diagnosis:    ICD-10-CM   1. Bipolar 1 disorder (HCC)  F31.9     2. Primary insomnia  F51.01       Assessment: Shannon Hart is a 72 y.o. female with a history of MDD, GAD, OCPD, bipolar disorder, Alzheimer's, TBI, OSA, and vitamin D deficiency who presents in person to [facility] at [facility] for initial evaluation on 04/10/2023.    At initial evaluation patient reported a history of bipolar disorder with manic episodes where she has decreased sleep, racing thoughts, flight of ideas, reckless behaviors, and pressured speech which can last 3-4 days. She denied any delusions, hallucinations, paranoia, or grandiosity.  During the depressive phases patient has low mood, decreased energy, amotivation, anhedonia, and at times passive SI.  She denied any SI since 2020 when she was hospitalized.  At initial evaluation patient was having mild fluctuations between high and low phases where patient was more energetic during the up phases and spent more time in bed during the down phases.  In addition to the diagnosis of bipolar disorder patient also has past history of a TBI following oxygen deprivation incident in 2006 and has had signs of memory loss over the past several years.  There has been concern for Alzheimer's and frontotemporal dementia based off of imaging.  Most recent MoCA was a 23 out of 30 on 01/17/23.  Shannon Hart presents for follow-up evaluation. Today, 06/17/23, patient reports ***  Plan: - Continue Lamictal 200 mg QD - Continue Venlafaxine XR 150 mg QD - Continue Trazodone 50 mg - Continue donepezil 10 mg QHS - Continue pregabalin 50 mg BID - CMP, CBC, B12, and TSH reviewed - Head CT w/o contrast from 07/12/22 reviewed and showed moderate cerebral atrophy, concentrated at the frontal, and temporal greater than posterior parietal lobes. 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  - Crisis resources  reviewed - Follow up in 2 months   Chief Complaint: No chief complaint on file.  HPI: ***   Past Psychiatric History: Patient had been connected with a psychiatric provider at [facility] in the past over 20 years ago.  She also reports having a therapist in the past but none currently.  Patient reports that she has been hospitalized in December of 2020 for suicidal ideation.  She reports a suicide attempt at the end of 2020 and the beginning of 2021.  Patient has tried Mirtazapine - not helpful for anxiety/depression, Lexapro, Celexa, Depakote - ineffective, Wellbutrin- ineffective, Abilify - akathesia, Seroquel - ineffective, Geodon, Prozac - ineffective, Cymbalta - caused hallucinations, Provigil, and gabapentin in the past.  Zyprexa had been discussed, but patient and family do not want this 2/2 risk of weight gain and side effects.   Lamictal has been extremely effective for her daughter.   Patient denies any substance use other than nicotine.  She reports smoking half a pack a day since she was 22.   Past Medical History:  Past Medical History:  Diagnosis Date   Allergy    Alzheimer disease (HCC)    Anxiety    Arthritis    Bipolar 1 disorder (HCC)    Cancer (HCC)    Cataract    CSF leak    Depression    Hyperlipidemia    Hypertension    Neuromuscular disorder (HCC)    Sleep apnea    TBI (traumatic brain injury) (HCC)  Thyroid disease    TIA (transient ischemic attack)    Vitamin B6 deficiency    Vitamin D deficiency     Past Surgical History:  Procedure Laterality Date   ABDOMINAL HYSTERECTOMY     DG SHOULDER RIGHT COMPLETE  2023   RECTOVAGINAL FISTULA CLOSURE     ROTATOR CUFF REPAIR     THYROIDECTOMY  2004    Family Psychiatric History: ***  Family History:  Family History  Problem Relation Age of Onset   Colon cancer Neg Hx    Colon polyps Neg Hx    Esophageal cancer Neg Hx    Rectal cancer Neg Hx    Stomach cancer Neg Hx     Social  History:  Social History   Socioeconomic History   Marital status: Divorced    Spouse name: Not on file   Number of children: Not on file   Years of education: Not on file   Highest education level: Not on file  Occupational History   Not on file  Tobacco Use   Smoking status: Every Day    Packs/day: 0.50    Years: 51.00    Additional pack years: 0.00    Total pack years: 25.50    Types: Cigarettes    Passive exposure: Current   Smokeless tobacco: Never  Substance and Sexual Activity   Alcohol use: Not Currently   Drug use: Not Currently   Sexual activity: Not Currently  Other Topics Concern   Not on file  Social History Narrative   Not on file   Social Determinants of Health   Financial Resource Strain: Not on file  Food Insecurity: No Food Insecurity (05/08/2023)   Hunger Vital Sign    Worried About Running Out of Food in the Last Year: Never true    Ran Out of Food in the Last Year: Never true  Transportation Needs: No Transportation Needs (05/08/2023)   PRAPARE - Administrator, Civil Service (Medical): No    Lack of Transportation (Non-Medical): No  Physical Activity: Not on file  Stress: Not on file  Social Connections: Not on file    Allergies:  Allergies  Allergen Reactions   Latex Rash   Other Rash    Tape made her have blisters   Wound Dressing Adhesive Rash    Current Medications: Current Outpatient Medications  Medication Sig Dispense Refill   acetaminophen (TYLENOL) 650 MG CR tablet Take 1,300 mg by mouth every 8 (eight) hours as needed for pain.     albuterol (VENTOLIN HFA) 108 (90 Base) MCG/ACT inhaler Inhale 2 puffs into the lungs every 6 (six) hours as needed for wheezing or shortness of breath. 8 g 0   ascorbic acid (VITAMIN C) 500 MG tablet Take 500 mg by mouth 2 (two) times daily.     atorvastatin (LIPITOR) 40 MG tablet Take 1 tablet (40 mg total) by mouth daily. 90 tablet 1   butalbital-acetaminophen-caffeine (FIORICET)  50-325-40 MG tablet Take 2 tablets by mouth as needed for headache. 20 tablet 0   denosumab (PROLIA) 60 MG/ML SOSY injection Inject 60 mg into the skin every 6 (six) months. 1 mL 0   donepezil (ARICEPT) 10 MG tablet Take 1 tablet (10 mg total) by mouth at bedtime. 90 tablet 3   ferrous sulfate 325 (65 FE) MG tablet Take 1 tablet (325 mg total) by mouth daily. 90 tablet 1   lamoTRIgine (LAMICTAL) 200 MG tablet Take 1 tablet (200 mg total) by mouth  daily. 90 tablet 1   levothyroxine (SYNTHROID) 75 MCG tablet Take 1 tablet (75 mcg total) by mouth daily. 90 tablet 3   lisinopril (ZESTRIL) 40 MG tablet Take 1 tablet (40 mg total) by mouth daily. 90 tablet 3   loratadine (CLARITIN) 10 MG tablet Take 10 mg by mouth daily.     melatonin 3 MG TABS tablet Take 3 mg by mouth at bedtime.     mirabegron ER (MYRBETRIQ) 50 MG TB24 tablet Take 50 mg by mouth daily.     nicotine (NICODERM CQ - DOSED IN MG/24 HOURS) 14 mg/24hr patch Place 1 patch (14 mg total) onto the skin daily. 28 patch 0   Omega-3 Fatty Acids (FISH OIL) 1000 MG CAPS Take 1 capsule by mouth daily.     pregabalin (LYRICA) 50 MG capsule Take 1 capsule by mouth twice daily. 60 capsule 5   Rimegepant Sulfate (NURTEC) 75 MG TBDP Place 1 tablet under the tongue every other day.     traZODone (DESYREL) 50 MG tablet Take 1 tablet (50 mg total) by mouth at bedtime. 90 tablet 1   trospium (SANCTURA) 20 MG tablet Take 20 mg by mouth 2 (two) times daily.     venlafaxine XR (EFFEXOR-XR) 150 MG 24 hr capsule Take 1 capsule (150 mg total) by mouth daily with breakfast. 90 capsule 3   No current facility-administered medications for this visit.     Musculoskeletal: Strength & Muscle Tone: {desc; muscle tone:32375} Gait & Station: {PE GAIT ED ZHYQ:65784} Patient leans: {Patient Leans:21022755}  Psychiatric Specialty Exam: Review of Systems  There were no vitals taken for this visit.There is no height or weight on file to calculate BMI.  General  Appearance: {Appearance:22683}  Eye Contact:  {BHH EYE CONTACT:22684}  Speech:  {Speech:22685}  Volume:  {Volume (PAA):22686}  Mood:  {BHH MOOD:22306}  Affect:  {Affect (PAA):22687}  Thought Process:  {Thought Process (PAA):22688}  Orientation:  {BHH ORIENTATION (PAA):22689}  Thought Content: {Thought Content:22690}   Suicidal Thoughts:  {ST/HT (PAA):22692}  Homicidal Thoughts:  {ST/HT (PAA):22692}  Memory:  {BHH MEMORY:22881}  Judgement:  {Judgement (PAA):22694}  Insight:  {Insight (PAA):22695}  Psychomotor Activity:  {Psychomotor (PAA):22696}  Concentration:  {Concentration:21399}  Recall:  {BHH GOOD/FAIR/POOR:22877}  Fund of Knowledge: {BHH GOOD/FAIR/POOR:22877}  Language: {BHH GOOD/FAIR/POOR:22877}  Akathisia:  {BHH YES OR NO:22294}  Handed:  {Handed:22697}  AIMS (if indicated): {Desc; done/not:10129}  Assets:  {Assets (PAA):22698}  ADL's:  {BHH ONG'E:95284}  Cognition: {chl bhh cognition:304700322}  Sleep:  {BHH GOOD/FAIR/POOR:22877}   Metabolic Disorder Labs: No results found for: "HGBA1C", "MPG" No results found for: "PROLACTIN" No results found for: "CHOL", "TRIG", "HDL", "CHOLHDL", "VLDL", "LDLCALC" Lab Results  Component Value Date   TSH 0.42 03/28/2023   TSH 0.14 (L) 11/09/2022    Therapeutic Level Labs: No results found for: "LITHIUM" No results found for: "VALPROATE" No results found for: "CBMZ"   Screenings: GAD-7    Flowsheet Row [facility]  Total GAD-7 Score 14      PHQ2-9    Flowsheet Row Office Visit from 05/02/2023 in [facility] Office Visit from 04/10/2023 in [facility] Office Visit from 03/28/2023 in [facility] Video Visit from 03/07/2023 in [facility]  PHQ-2 Total Score 0 3 0 0  PHQ-9 Total Score -- 17 -- --       Flowsheet  Row ED to Hosp-Admission (Discharged) from 05/08/2023 in [facility] GENERAL MED/SURG UNIT Office Visit from 04/10/2023 in [facility] ED from 06/15/2021 in [facility] Emergency Department at [facility]  C-SSRS RISK CATEGORY No Risk Low Risk No Risk       Collaboration of Care: Collaboration of Care: Primary Care Provider AEB chart review and Other provider involved in patient's care AEB ED and otolaryngology chart review  Patient/Guardian was advised Release of Information must be obtained prior to any record release in order to collaborate their care with an outside provider. Patient/Guardian was advised if they have not already done so to contact the registration department to sign all necessary forms in order for [place] to release information regarding their care.   Consent: Patient/Guardian gives verbal consent for treatment and assignment of benefits for services provided during this visit. Patient/Guardian expressed understanding and agreed to proceed.    Stasia Cavalier, MD 06/17/2023, 8:48 AM

## 2023-06-28 ENCOUNTER — Other Ambulatory Visit: Payer: Self-pay | Admitting: Adult Health

## 2023-07-05 NOTE — Telephone Encounter (Signed)
Called [employer] [phone] and spoke with [place] A. And she initiated Prior Authorization over the phone due to portal would not pull up patient.   Authorization went to Pending.  Faxed OV notes to Fax: [phone] as requested.   Awaiting Prior Authorization.   WU#:[id number]

## 2023-07-09 NOTE — Telephone Encounter (Signed)
Received fax from Insurance stating that Prolia was DENIED. Patient has to: *Try and fail an oral AND an injectable preferred medicine  Preferred Oral -Alendronate (Fosamax) -Ibandronate (Boniva) -Risedronate(Atelvia or Actonel)  Preferred Injectable -Zoledronic (Zometa) -Pamidronate (Aredia)  Patient daughter Notified and agreed.   Please Advise.

## 2023-07-09 NOTE — Addendum Note (Signed)
Addended by: Nelda Severe A on: 07/09/2023 04:43 PM   Modules accepted: Orders

## 2023-07-09 NOTE — Telephone Encounter (Signed)
Medina-Vargas, Monina C, NP  You1 hour ago (3:21 PM)   I can start her on Boniva 150 mg by mouth monthly for osteoporosis. Pls let me know if you desire Boniva so I can order it to your pharmacy.  Thanks.  Monina     Patient daughter notified and agreed.  Pended Rx and sent to [facility] for approval

## 2023-07-29 ENCOUNTER — Encounter: Payer: Self-pay | Admitting: Urology

## 2023-07-29 ENCOUNTER — Ambulatory Visit: Payer: Medicare Other | Admitting: Urology

## 2023-07-29 VITALS — BP 163/81 | HR 76 | Ht 64.0 in | Wt 152.0 lb

## 2023-07-29 DIAGNOSIS — N3946 Mixed incontinence: Secondary | ICD-10-CM

## 2023-07-29 LAB — URINALYSIS, COMPLETE
Bilirubin, UA: NEGATIVE
Glucose, UA: NEGATIVE
Ketones, UA: NEGATIVE
Nitrite, UA: POSITIVE — AB
Protein,UA: NEGATIVE
Specific Gravity, UA: 1.01 (ref 1.005–1.030)
Urobilinogen, Ur: 0.2 mg/dL (ref 0.2–1.0)
pH, UA: 6 (ref 5.0–7.5)

## 2023-07-29 LAB — MICROSCOPIC EXAMINATION

## 2023-07-29 MED ORDER — OXYBUTYNIN CHLORIDE ER 10 MG PO TB24: 10.0000 mg | ORAL_TABLET | Freq: Every day | ORAL | 3 refills | Status: DC

## 2023-07-29 NOTE — Progress Notes (Signed)
07/29/2023 2:27 PM   Shannon Hart 04/16/1951 [id number]  Referring provider: Gillis Santa, NP [address] [place],  [place] [address]  Chief Complaint  Patient presents with   Cysto    HPI: I was consulted to assess the patient's urinary incontinence.  She primarily has urge incontinence.  She can leak sometimes with coughing sneezing.  She has moderate to severe bedwetting.  She wears 5-8 pads a day moderately wet   She voids every 2 hours gets up to 3 times a night.  Flow was good.   She has had a previous bladder suspension.  It sounds like she may be having a planned bladder repair in [facility] but she is moving to [place] where her daughter lives.  I reviewed the medical records and she is likely had at least 1 prolapse repair and perhaps an abdominal repair with mesh.  She is also been tried on Gemtesa in the past.  I looked at the urology notes from 2023 and they talked her about the 3 refractory OAB therapies and were going to consult for a pessary to gynecology   She has had a TIA and a hysterectomy.  She has a history of traumatic brain injury and or stroke.   She is currently on Myrbetriq and trospium with no benefit.  She has had no other treatments.   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  She has bedwetting frequency and nocturia.  She will return with urodynamics and cystoscopy.  She has neurogenic bladder risk factors.  Call if culture positive.  She has high treatment goals and is hoping to get helped   Today Frequency stable.  No culture from last visit.  She had a positive culture in February 2024 On urodynamics patient did not void and was catheterized for 25 mL.  Maximum bladder capacity was 202 mL.  She had increased bladder sensation.  She had impressive detrusor overactivity reaching pressure 31 cm of water.  She had urgency and leaked a small amount.  No stress incontinence with Valsalva  pressure of 106 cm of water.  During voluntary voiding she voided 17 mL with a maximal flow of 3 mL/s.  Max voiding pressure ranged between 27 and 40 cm of water.  She was generating detrusor contractions with little to no flow in interrupted pattern.  Residual was 185 mL.  Bladder neck descent at less than a centimeter.  No prolapse.  The details of the urodynamics are signed dictated  Pelvic examination no stress incontinence or prolapse  Cystoscopy: Patient underwent flexible cystoscopy.  Bladder culture and trigone were normal.  Urine was cloudy with a few white flecks.  No carcinoma.        PMH: Past Medical History:  Diagnosis Date   Allergy    Alzheimer disease (HCC)    Anxiety    Arthritis    Bipolar 1 disorder (HCC)    Cancer (HCC)    Cataract    CSF leak    Depression    Hyperlipidemia    Hypertension    Neuromuscular disorder (HCC)    Sleep apnea    TBI (traumatic brain injury) (HCC)    Thyroid disease    TIA (transient ischemic attack)    Vitamin B6 deficiency    Vitamin D deficiency     Surgical History: Past Surgical History:  Procedure Laterality Date   ABDOMINAL HYSTERECTOMY     DG SHOULDER RIGHT COMPLETE  2023  RECTOVAGINAL FISTULA CLOSURE     ROTATOR CUFF REPAIR     THYROIDECTOMY  2004    Home Medications:  Allergies as of 07/29/2023       Reactions   Latex Rash   Other Rash   Tape made her have blisters   Wound Dressing Adhesive Rash        Medication List        Accurate as of July 29, 2023  2:27 PM. If you have any questions, ask your nurse or doctor.          acetaminophen 650 MG CR tablet Commonly known as: TYLENOL Take 1,300 mg by mouth every 8 (eight) hours as needed for pain.   albuterol 108 (90 Base) MCG/ACT inhaler Commonly known as: VENTOLIN HFA Inhale 2 puffs into the lungs every 6 (six) hours as needed for wheezing or shortness of breath.   ascorbic acid 500 MG tablet Commonly known as: VITAMIN C Take 500 mg by  mouth 2 (two) times daily.   atorvastatin 40 MG tablet Commonly known as: LIPITOR Take 1 tablet by mouth daily.   butalbital-acetaminophen-caffeine 50-325-40 MG tablet Commonly known as: FIORICET Take 2 tablets by mouth as needed for headache.   donepezil 10 MG tablet Commonly known as: ARICEPT Take 1 tablet (10 mg total) by mouth at bedtime.   ferrous sulfate 325 (65 FE) MG tablet Take 1 tablet (325 mg total) by mouth daily.   Fish Oil 1000 MG Caps Take 1 capsule by mouth daily.   ibandronate 150 MG tablet Commonly known as: BONIVA Take 150 mg by mouth every 30 (thirty) days. Take in the morning with a full glass of water, on an empty stomach, and do not take anything else by mouth or lie down for the next 30 min.   lamoTRIgine 200 MG tablet Commonly known as: LAMICTAL Take 1 tablet (200 mg total) by mouth daily.   levothyroxine 75 MCG tablet Commonly known as: SYNTHROID Take 1 tablet (75 mcg total) by mouth daily.   lisinopril 40 MG tablet Commonly known as: ZESTRIL Take 1 tablet (40 mg total) by mouth daily.   loratadine 10 MG tablet Commonly known as: CLARITIN Take 10 mg by mouth daily.   melatonin 3 MG Tabs tablet Take 3 mg by mouth at bedtime.   Myrbetriq 50 MG Tb24 tablet Generic drug: mirabegron ER Take 50 mg by mouth daily.   nicotine 14 mg/24hr patch Commonly known as: NICODERM CQ - dosed in mg/24 hours Place 1 patch (14 mg total) onto the skin daily.   Nurtec 75 MG Tbdp Generic drug: Rimegepant Sulfate Place 1 tablet under the tongue every other day.   pregabalin 50 MG capsule Commonly known as: LYRICA Take 1 capsule by mouth twice daily.   traZODone 50 MG tablet Commonly known as: DESYREL Take 1 tablet (50 mg total) by mouth at bedtime.   trospium 20 MG tablet Commonly known as: SANCTURA Take 20 mg by mouth 2 (two) times daily.   venlafaxine XR 150 MG 24 hr capsule Commonly known as: EFFEXOR-XR Take 1 capsule (150 mg total) by mouth  daily with breakfast.        Allergies:  Allergies  Allergen Reactions   Latex Rash   Other Rash    Tape made her have blisters   Wound Dressing Adhesive Rash    Family History: Family History  Problem Relation Age of Onset   Colon cancer Neg Hx    Colon polyps Neg Hx  Esophageal cancer Neg Hx    Rectal cancer Neg Hx    Stomach cancer Neg Hx     Social History:  reports that she has been smoking cigarettes. She has a 25.5 pack-year smoking history. She has been exposed to tobacco smoke. She has never used smokeless tobacco. She reports that she does not currently use alcohol. She reports that she does not currently use drugs.  ROS:                                        Physical Exam: There were no vitals taken for this visit.  Constitutional:  Alert and oriented, No acute distress. HEENT: [place] AT, moist mucus membranes.  Trachea midline, no masses.   Laboratory Data: Lab Results  Component Value Date   WBC 15.3 (H) 05/09/2023   HGB 11.6 (L) 05/09/2023   HCT 35.4 (L) 05/09/2023   MCV 94.1 05/09/2023   PLT 199 05/09/2023    Lab Results  Component Value Date   CREATININE 1.06 (H) 05/09/2023    No results found for: "PSA"  No results found for: "TESTOSTERONE"  No results found for: "HGBA1C"  Urinalysis    Component Value Date/Time   COLORURINE YELLOW 05/08/2023 1500   APPEARANCEUR CLEAR 05/08/2023 1500   APPEARANCEUR Cloudy (A) 01/14/2023 1343   LABSPEC 1.015 05/08/2023 1500   PHURINE 6.0 05/08/2023 1500   GLUCOSEU NEGATIVE 05/08/2023 1500   HGBUR NEGATIVE 05/08/2023 1500   BILIRUBINUR NEGATIVE 05/08/2023 1500   BILIRUBINUR Negative 01/14/2023 1343   KETONESUR 5 (A) 05/08/2023 1500   PROTEINUR 100 (A) 05/08/2023 1500   NITRITE NEGATIVE 05/08/2023 1500   LEUKOCYTESUR NEGATIVE 05/08/2023 1500    Pertinent Imaging:   Assessment & Plan: Recognizing limitations like to see the patient back on oxybutynin ER 10 mg 3o x 11.  I  want to see if the culture is positive.  I am suspect that she may be getting some urinary tract infections based upon cystoscopy today and 1 previous positive culture.  We will then discuss refractory treatments recognizing age and some comorbidities.  90% of bladder dysfunction there is bladder overactivity.  Stop Myrbetriq and trospium  The request of her daughter I went over InterStim and PTNS in full detail with templates.  I think they have probably heard this before as noted above.  They said she did not want to do Botox because of the fear retention.  They understand and waiting for urine culture.  Also reassess in 6 weeks on oxybutynin.  Her daughter thinks she tried some of her daughter's pills at 1 time and the oxybutynin worked but they were afraid of CNS side effects.  Relationship with memory discussed.  Continue and reassess on oxybutynin  There are no diagnoses linked to this encounter.  No follow-ups on file.  Martina Sinner, MD  [facility] [address] [place], [place] [address] [phone]

## 2023-07-31 ENCOUNTER — Encounter: Payer: Self-pay | Admitting: Dermatology

## 2023-07-31 ENCOUNTER — Ambulatory Visit: Payer: Medicare Other | Admitting: Dermatology

## 2023-07-31 DIAGNOSIS — L821 Other seborrheic keratosis: Secondary | ICD-10-CM

## 2023-07-31 DIAGNOSIS — L820 Inflamed seborrheic keratosis: Secondary | ICD-10-CM | POA: Diagnosis not present

## 2023-07-31 DIAGNOSIS — Z1283 Encounter for screening for malignant neoplasm of skin: Secondary | ICD-10-CM

## 2023-07-31 DIAGNOSIS — D229 Melanocytic nevi, unspecified: Secondary | ICD-10-CM

## 2023-07-31 DIAGNOSIS — L578 Other skin changes due to chronic exposure to nonionizing radiation: Secondary | ICD-10-CM

## 2023-07-31 DIAGNOSIS — L219 Seborrheic dermatitis, unspecified: Secondary | ICD-10-CM

## 2023-07-31 DIAGNOSIS — L814 Other melanin hyperpigmentation: Secondary | ICD-10-CM | POA: Diagnosis not present

## 2023-07-31 DIAGNOSIS — W908XXA Exposure to other nonionizing radiation, initial encounter: Secondary | ICD-10-CM | POA: Diagnosis not present

## 2023-07-31 DIAGNOSIS — D1801 Hemangioma of skin and subcutaneous tissue: Secondary | ICD-10-CM

## 2023-07-31 DIAGNOSIS — Z85828 Personal history of other malignant neoplasm of skin: Secondary | ICD-10-CM

## 2023-07-31 MED ORDER — KETOCONAZOLE 2 % EX SHAM
MEDICATED_SHAMPOO | CUTANEOUS | 11 refills | Status: DC
Start: 2023-07-31 — End: 2024-08-13

## 2023-07-31 NOTE — Patient Instructions (Addendum)
Cryotherapy Aftercare  Wash gently with soap and water everyday.   Apply Vaseline Jelly daily until healed.    Use ketoconazole shampoo  three times per week, massage into scalp and leave in for 8-10 minutes before rinsing out. This can be followed by conditioner or shampoo and conditioner of your choice.    Recommend daily broad spectrum sunscreen SPF 30+ to sun-exposed areas, reapply every 2 hours as needed. Call for new or changing lesions.  Staying in the shade or wearing long sleeves, sun glasses (UVA+UVB protection) and wide brim hats (4-inch brim around the entire circumference of the hat) are also recommended for sun protection.    Melanoma ABCDEs  Melanoma is the most dangerous type of skin cancer, and is the leading cause of death from skin disease.  You are more likely to develop melanoma if you: Have light-colored skin, light-colored eyes, or red or blond hair Spend a lot of time in the sun Tan regularly, either outdoors or in a tanning bed Have had blistering sunburns, especially during childhood Have a close family member who has had a melanoma Have atypical moles or large birthmarks  Early detection of melanoma is key since treatment is typically straightforward and cure rates are extremely high if we catch it early.   The first sign of melanoma is often a change in a mole or a new dark spot.  The ABCDE system is a way of remembering the signs of melanoma.  A for asymmetry:  The two halves do not match. B for border:  The edges of the growth are irregular. C for color:  A mixture of colors are present instead of an even brown color. D for diameter:  Melanomas are usually (but not always) greater than 6mm - the size of a pencil eraser. E for evolution:  The spot keeps changing in size, shape, and color.  Please check your skin once per month between visits. You can use a small mirror in front and a large mirror behind you to keep an eye on the back side or your body.    If you see any new or changing lesions before your next follow-up, please call to schedule a visit.  Please continue daily skin protection including broad spectrum sunscreen SPF 30+ to sun-exposed areas, reapplying every 2 hours as needed when you're outdoors.   Staying in the shade or wearing long sleeves, sun glasses (UVA+UVB protection) and wide brim hats (4-inch brim around the entire circumference of the hat) are also recommended for sun protection.    Due to recent changes in healthcare laws, you may see results of your pathology and/or laboratory studies on MyChart before the doctors have had a chance to review them. We understand that in some cases there may be results that are confusing or concerning to you. Please understand that not all results are received at the same time and often the doctors may need to interpret multiple results in order to provide you with the best plan of care or course of treatment. Therefore, we ask that you please give [place] 2 business days to thoroughly review all your results before contacting the office for clarification. Should we see a critical lab result, you will be contacted sooner.   If You Need Anything After Your Visit  If you have any questions or concerns for your doctor, please call our main line at [phone] and press option 4 to reach your doctor's medical assistant. If no one answers, please leave a  voicemail as directed and we will return your call as soon as possible. Messages left after 4 pm will be answered the following business day.   You may also send [place] a message via MyChart. We typically respond to MyChart messages within 1-2 business days.  For prescription refills, please ask your pharmacy to contact our office. Our fax number is [phone].  If you have an urgent issue when the clinic is closed that cannot wait until the next business day, you can page your doctor at the number below.    Please note that while we do our best  to be available for urgent issues outside of office hours, we are not available 24/7.   If you have an urgent issue and are unable to reach [place], you may choose to seek medical care at your doctor's office, retail clinic, urgent care center, or emergency room.  If you have a medical emergency, please immediately call 911 or go to the emergency department.  Pager Numbers  - Dr. Gwen Pounds: [phone]  - Dr. Roseanne Reno: [phone]  - Dr. Katrinka Blazing: [phone]   In the event of inclement weather, please call our main line at [phone] for an update on the status of any delays or closures.  Dermatology Medication Tips: Please keep the boxes that topical medications come in in order to help keep track of the instructions about where and how to use these. Pharmacies typically print the medication instructions only on the boxes and not directly on the medication tubes.   If your medication is too expensive, please contact our office at [phone] option 4 or send [place] a message through MyChart.   We are unable to tell what your co-pay for medications will be in advance as this is different depending on your insurance coverage. However, we may be able to find a substitute medication at lower cost or fill out paperwork to get insurance to cover a needed medication.   If a prior authorization is required to get your medication covered by your insurance company, please allow [place] 1-2 business days to complete this process.  Drug prices often vary depending on where the prescription is filled and some pharmacies may offer cheaper prices.  The website www.goodrx.com contains coupons for medications through different pharmacies. The prices here do not account for what the cost may be with help from insurance (it may be cheaper with your insurance), but the website can give you the price if you did not use any insurance.  - You can print the associated coupon and take it with your prescription to the  pharmacy.  - You may also stop by our office during regular business hours and pick up a GoodRx coupon card.  - If you need your prescription sent electronically to a different pharmacy, notify our office through [facility] or by phone at [phone] option 4.     Si Usted Necesita Algo Despus de Su Visita  Tambin puede enviarnos un mensaje a travs de Clinical cytogeneticist. Por lo general respondemos a los mensajes de MyChart en el transcurso de 1 a 2 das hbiles.  Para renovar recetas, por favor pida a su farmacia que se ponga en contacto con nuestra oficina. Annie Sable de fax es [place] [phone].  Si tiene un asunto urgente cuando la clnica est cerrada y que no puede esperar hasta el siguiente da hbil, puede llamar/localizar a su doctor(a) al nmero que aparece a continuacin.   Por favor, tenga en cuenta que aunque hacemos todo  lo posible para estar disponibles para asuntos urgentes fuera del horario de oficina, no estamos disponibles las 24 horas del da, los 7 [address]  [address] de la [place].   Si tiene un problema urgente y no puede comunicarse con nosotros, puede optar por buscar atencin mdica  en el consultorio de su doctor(a), en una clnica privada, en un centro de atencin urgente o en una sala de emergencias.  Si tiene Engineer, drilling, por favor llame inmediatamente al 911 o vaya a la sala de emergencias.  Nmeros de bper  - Dr. Gwen Pounds: [phone]  - Dra. Roseanne Reno: [phone]  - Dr. Katrinka Blazing: [phone]   En caso de inclemencias del tiempo, por favor llame a Lacy Duverney principal al [phone] para una actualizacin sobre el [place] de cualquier retraso o cierre.  Consejos para la medicacin en dermatologa: Por favor, guarde las cajas en las que vienen los medicamentos de uso tpico para ayudarle a seguir las instrucciones sobre dnde y cmo usarlos. Las farmacias generalmente imprimen las instrucciones del medicamento slo en las cajas y no directamente en los  tubos del [place].   Si su medicamento es muy caro, por favor, pngase en contacto con Rolm Gala llamando al [phone] y presione la opcin 4 o envenos un mensaje a travs de Clinical cytogeneticist.   No podemos decirle cul ser su copago por los medicamentos por adelantado ya que esto es diferente dependiendo de la cobertura de su seguro. Sin embargo, es posible que podamos encontrar un medicamento sustituto a Audiological scientist un formulario para que el seguro cubra el medicamento que se considera necesario.   Si se requiere una autorizacin previa para que su compaa de seguros [place] su medicamento, por favor permtanos de 1 a 2 das hbiles para completar [address]  Los precios de los medicamentos varan con frecuencia dependiendo del Environmental consultant de dnde se surte la receta y alguna farmacias pueden ofrecer precios ms baratos.  El sitio web www.goodrx.com tiene cupones para medicamentos de Health and safety inspector. Los precios aqu no tienen en cuenta lo que podra costar con la ayuda del seguro (puede ser ms barato con su seguro), pero el sitio web puede darle el precio si no utiliz Tourist information centre manager.  - Puede imprimir el cupn correspondiente y llevarlo con su receta a la farmacia.  - Tambin puede pasar por nuestra oficina durante el horario de atencin regular y Education officer, museum una tarjeta de cupones de GoodRx.  - Si necesita que su receta se enve electrnicamente a una farmacia diferente, informe a nuestra oficina a travs de MyChart de [place] o por telfono llamando al [phone] y presione la opcin 4.

## 2023-07-31 NOTE — Progress Notes (Signed)
New Patient Visit   Subjective  Shannon Hart is a 72 y.o. female who presents for the following: Skin Cancer Screening and Full Body Skin Exam. Hx of precancerous areas. Previous patient at [facility] dermatology. Concerned with spot on back.  Per patient's daughter patient has Hx of BCC on left cheek that [facility] "scrapes off every year".  States she has psoriasis on her scalp. Uses Ketoconazole 2% shampoo for years.   States she grew up in [place]. Spent a lot of time at [employer].   The patient presents for Total-Body Skin Exam (TBSE) for skin cancer screening and mole check. The patient has spots, moles and lesions to be evaluated, some may be new or changing and the patient may have concern these could be cancer.  Daughter, Shanda Bumps, is with patient and contributes to history.   The following portions of the chart were reviewed this encounter and updated as appropriate: medications, allergies, medical history  Review of Systems:  No other skin or systemic complaints except as noted in HPI or Assessment and Plan.  Objective  Well appearing patient in no apparent distress; mood and affect are within normal limits.  A full examination was performed including scalp, head, eyes, ears, nose, lips, neck, chest, axillae, abdomen, back, buttocks, bilateral upper extremities, bilateral lower extremities, hands, feet, fingers, toes, fingernails, and toenails. All findings within normal limits unless otherwise noted below.   Relevant physical exam findings are noted in the Assessment and Plan.  Back x3 (3) Erythematous keratotic or waxy stuck-on papule or plaque.  Exam of nails limited by presence of nail polish.   Assessment & Plan   SKIN CANCER SCREENING PERFORMED TODAY.  ACTINIC DAMAGE - Chronic condition, secondary to cumulative UV/sun exposure - diffuse scaly erythematous macules with underlying dyspigmentation - Recommend daily broad spectrum sunscreen SPF 30+ to sun-exposed areas,  reapply every 2 hours as needed.  - Staying in the shade or wearing long sleeves, sun glasses (UVA+UVB protection) and wide brim hats (4-inch brim around the entire circumference of the hat) are also recommended for sun protection.  - Call for new or changing lesions.  LENTIGINES, SEBORRHEIC KERATOSES (multiple), HEMANGIOMAS - Benign normal skin lesions - Benign-appearing - Call for any changes  MELANOCYTIC NEVI - Tan-brown and/or pink-flesh-colored symmetric macules and papules - Benign appearing on exam today - Observation - Call clinic for new or changing moles - Recommend daily use of broad spectrum spf 30+ sunscreen to sun-exposed areas.  - Check nails when remove polish.    SEBORRHEIC DERMATITIS > psoriasis Exam: scalp clear  Chronic and persistent condition with duration or expected duration over one year. Condition has improved with treatment, currently at goal.   Seborrheic Dermatitis is a chronic persistent rash characterized by pinkness and scaling most commonly of the mid face but also can occur on the scalp (dandruff), ears; mid chest, mid back and groin.  It tends to be exacerbated by stress and cooler weather.  People who have neurologic disease may experience new onset or exacerbation of existing seborrheic dermatitis.  The condition is not curable but treatable and can be controlled.  Treatment Plan: Continue ketoconazole shampoo  three times per week, massage into scalp and leave in for 8-10 minutes before rinsing out. This can be followed by conditioner or shampoo and conditioner of your choice.   Inflamed seborrheic keratosis (3) Back x3  Symptomatic, irritating, patient would like treated.  Destruction of lesion - Back x3 (3)  Destruction method: cryotherapy  Informed consent: discussed and consent obtained   Lesion destroyed using liquid nitrogen: Yes   Region frozen until ice ball extended beyond lesion: Yes   Outcome: patient tolerated procedure well  with no complications   Post-procedure details: wound care instructions given   Additional details:  Prior to procedure, discussed risks of blister formation, small wound, skin dyspigmentation, or rare scar following cryotherapy. Recommend Vaseline ointment to treated areas while healing.   Lentigines  Multiple benign nevi  Seborrheic keratoses  Actinic elastosis  Cherry angioma    Return in about 1 year (around 07/30/2024) for TBSE.  I, Lawson Radar, CMA, am acting as scribe for Elie Goody, MD.   Documentation: I have reviewed the above documentation for accuracy and completeness, and I agree with the above.  Elie Goody, MD

## 2023-08-01 LAB — CULTURE, URINE COMPREHENSIVE

## 2023-08-02 ENCOUNTER — Other Ambulatory Visit: Payer: Self-pay | Admitting: *Deleted

## 2023-08-02 MED ORDER — NITROFURANTOIN MONOHYD MACRO 100 MG PO CAPS: 100.0000 mg | ORAL_CAPSULE | Freq: Two times a day (BID) | ORAL | 0 refills | Status: AC

## 2023-08-19 ENCOUNTER — Telehealth: Payer: Medicare Other

## 2023-08-19 NOTE — Transitions of Care (Post Inpatient/ED Visit) (Signed)
08/19/2023  Name: Shannon Hart MRN: [id number] DOB: 1951-05-26  Today's TOC FU Call Status: Today's TOC FU Call Status:: Successful TOC FU Call Completed TOC FU Call Complete Date: 08/19/23 Patient's Name and Date of Birth confirmed. Call completed with daughter Lexxie Stoecker Transition Care Management Follow-up Telephone Call Date of Discharge: 08/15/23 Type of Discharge: Emergency Department Reason for ED Visit: Other: (Chronic Sinusitis.) How have you been since you were released from the hospital?: Better Any questions or concerns?: Yes Patient Questions/Concerns:: Patient will save questions for appoitnment.  Items Reviewed: Did you receive and understand the discharge instructions provided?: Yes Medications obtained,verified, and reconciled?: Yes (Medications Reviewed) Any new allergies since your discharge?: No Dietary orders reviewed?: No Do you have support at home?: Yes People in Home: child(ren), adult Name of Support/Comfort Primary Source: Arnela Hirko  Medications Reviewed Today: Medications Reviewed Today     Reviewed by Dicky Doe, CMA (Certified Medical Assistant) on 08/19/23 at 1059  Med List Status: <None>   Medication Order Taking? Sig Documenting Provider Last Dose Status Informant  acetaminophen (TYLENOL) 650 MG CR tablet [id number] Yes Take 1,300 mg by mouth every 8 (eight) hours as needed for pain. [provider] Taking Active Child  albuterol (VENTOLIN HFA) 108 (90 Base) MCG/ACT inhaler [id number] Yes Inhale 2 puffs into the lungs every 6 (six) hours as needed for wheezing or shortness of breath. Burnadette Pop, MD Taking Active   ascorbic acid (VITAMIN C) 500 MG tablet [id number] Yes Take 500 mg by mouth 2 (two) times daily. [provider] Taking Active Child  atorvastatin (LIPITOR) 40 MG tablet [id number] Yes Take 1 tablet by mouth daily. Medina-Vargas, Monina C, NP Taking Active   butalbital-acetaminophen-caffeine (FIORICET)  50-325-40 MG tablet [id number] Yes Take 2 tablets by mouth as needed for headache. Medina-Vargas, Monina C, NP Taking Active   donepezil (ARICEPT) 10 MG tablet [id number] Yes Take 1 tablet (10 mg total) by mouth at bedtime. Levert Feinstein, MD Taking Active Child  ferrous sulfate 325 (65 FE) MG tablet [id number] Yes Take 1 tablet (325 mg total) by mouth daily. Medina-Vargas, Monina C, NP Taking Active Child  ibandronate (BONIVA) 150 MG tablet [id number] Yes Take 150 mg by mouth every 30 (thirty) days. Take in the morning with a full glass of water, on an empty stomach, and do not take anything else by mouth or lie down for the next 30 min. [provider] Taking Active   ketoconazole (NIZORAL) 2 % shampoo [id number] Yes Three times per week, massage into scalp and leave in for 8-10 minutes before rinsing out. Elie Goody, MD Taking Active   lamoTRIgine (LAMICTAL) 200 MG tablet [id number] Yes Take 1 tablet (200 mg total) by mouth daily. Stasia Cavalier, MD Taking Active Child  levothyroxine (SYNTHROID) 75 MCG tablet [id number] Yes Take 1 tablet (75 mcg total) by mouth daily. Medina-Vargas, Monina C, NP Taking Active Child  lisinopril (ZESTRIL) 40 MG tablet [id number] Yes Take 1 tablet (40 mg total) by mouth daily. Medina-Vargas, Monina C, NP Taking Active Child  loratadine (CLARITIN) 10 MG tablet [id number] Yes Take 10 mg by mouth daily. [provider] Taking Active Child  melatonin 3 MG TABS tablet [id number] Yes Take 3 mg by mouth at bedtime. [provider] Taking Active Child  nicotine (NICODERM CQ - DOSED IN MG/24 HOURS) 14 mg/24hr patch [id number] Yes Place 1 patch (14 mg total) onto the skin daily. Medina-Vargas, Monina C, NP Taking Active   Omega-3 Fatty Acids (FISH OIL)  1000 MG CAPS [id number] Yes Take 1 capsule by mouth daily. [provider] Taking Active Child  oxybutynin (DITROPAN-XL) 10 MG 24 hr tablet [id number] Yes Take 1 tablet (10 mg total) by mouth at bedtime.  Alfredo Martinez, MD Taking Active   pregabalin (LYRICA) 50 MG capsule [id number] Yes Take 1 capsule by mouth twice daily. Medina-Vargas, Monina C, NP Taking Active Child  Rimegepant Sulfate (NURTEC) 75 MG TBDP [id number] Yes Place 1 tablet under the tongue every other day. [provider] Taking Active Child  traZODone (DESYREL) 50 MG tablet [id number] Yes Take 1 tablet (50 mg total) by mouth at bedtime. Stasia Cavalier, MD Taking Active Child  trimethoprim (TRIMPEX) 100 MG tablet [id number] Yes Take 100 mg by mouth daily. [provider] Taking Active   venlafaxine XR (EFFEXOR-XR) 150 MG 24 hr capsule [id number] Yes Take 1 capsule (150 mg total) by mouth daily with breakfast. Medina-Vargas, Monina C, NP Taking Active Child            Home Care and Equipment/Supplies: Were Home Health Services Ordered?: No Any new equipment or medical supplies ordered?: No  Functional Questionnaire: Do you need assistance with bathing/showering or dressing?: No Do you need assistance with meal preparation?: Yes Do you need assistance with eating?: No Do you have difficulty maintaining continence: Yes Do you need assistance with getting out of bed/getting out of a chair/moving?: No Do you have difficulty managing or taking your medications?: Yes  Follow up appointments reviewed: PCP Follow-up appointment confirmed?: Yes Date of PCP follow-up appointment?: 08/26/23 Follow-up Provider: Ella Bodo, NP [facility] Follow-up appointment confirmed?: No Do you need transportation to your follow-up appointment?: No Do you understand care options if your condition(s) worsen?: Yes-patient verbalized understanding    SIGNATURE: Uilani Sanville.D/RMA

## 2023-08-26 ENCOUNTER — Ambulatory Visit (INDEPENDENT_AMBULATORY_CARE_PROVIDER_SITE_OTHER): Payer: Medicare Other | Admitting: Adult Health

## 2023-08-26 ENCOUNTER — Encounter: Payer: Self-pay | Admitting: Adult Health

## 2023-08-26 VITALS — BP 127/78 | HR 74 | Temp 96.9°F | Resp 18 | Ht 63.0 in | Wt 160.6 lb

## 2023-08-26 DIAGNOSIS — F319 Bipolar disorder, unspecified: Secondary | ICD-10-CM

## 2023-08-26 DIAGNOSIS — G9600 Cerebrospinal fluid leak, unspecified: Secondary | ICD-10-CM

## 2023-08-26 DIAGNOSIS — E890 Postprocedural hypothyroidism: Secondary | ICD-10-CM

## 2023-08-26 DIAGNOSIS — M810 Age-related osteoporosis without current pathological fracture: Secondary | ICD-10-CM

## 2023-08-26 DIAGNOSIS — R413 Other amnesia: Secondary | ICD-10-CM

## 2023-08-26 DIAGNOSIS — I10 Essential (primary) hypertension: Secondary | ICD-10-CM

## 2023-08-26 NOTE — Progress Notes (Unsigned)
[facility] clinic  Provider:   Code Status:  Full Code  Goals of Care:     05/08/2023   11:43 AM  Advanced Directives  Does Patient Have a Medical Advance Directive? No  Would patient like information on creating a medical advance directive? No - Guardian declined     Chief Complaint  Patient presents with   Transitions Of Care    TOC     HPI: Patient is a 72 y.o. female seen today for an acute visit for  Past Medical History:  Diagnosis Date   Allergy    Alzheimer disease (HCC)    Anxiety    Arthritis    Bipolar 1 disorder (HCC)    Cancer (HCC)    Cataract    CSF leak    Depression    Hyperlipidemia    Hypertension    Neuromuscular disorder (HCC)    Sleep apnea    TBI (traumatic brain injury) (HCC)    Thyroid disease    TIA (transient ischemic attack)    Vitamin B6 deficiency    Vitamin D deficiency     Past Surgical History:  Procedure Laterality Date   ABDOMINAL HYSTERECTOMY     DG SHOULDER RIGHT COMPLETE  2023   RECTOVAGINAL FISTULA CLOSURE     ROTATOR CUFF REPAIR     THYROIDECTOMY  2004    Allergies  Allergen Reactions   Latex Rash   Other Rash    Tape made her have blisters   Wound Dressing Adhesive Rash    Outpatient Encounter Medications as of 08/26/2023  Medication Sig   acetaminophen (TYLENOL) 650 MG CR tablet Take 1,300 mg by mouth every 8 (eight) hours as needed for pain.   albuterol (VENTOLIN HFA) 108 (90 Base) MCG/ACT inhaler Inhale 2 puffs into the lungs every 6 (six) hours as needed for wheezing or shortness of breath.   ascorbic acid (VITAMIN C) 500 MG tablet Take 500 mg by mouth 2 (two) times daily.   atorvastatin (LIPITOR) 40 MG tablet Take 1 tablet by mouth daily.   butalbital-acetaminophen-caffeine (FIORICET) 50-325-40 MG tablet Take 2 tablets by mouth as needed for headache.   donepezil (ARICEPT) 10 MG tablet Take 1 tablet (10 mg total) by mouth at bedtime.   ferrous sulfate 325 (65 FE) MG tablet Take 1 tablet (325 mg total) by  mouth daily.   ibandronate (BONIVA) 150 MG tablet Take 150 mg by mouth every 30 (thirty) days. Take in the morning with a full glass of water, on an empty stomach, and do not take anything else by mouth or lie down for the next 30 min.   ketoconazole (NIZORAL) 2 % shampoo Three times per week, massage into scalp and leave in for 8-10 minutes before rinsing out.   lamoTRIgine (LAMICTAL) 200 MG tablet Take 1 tablet (200 mg total) by mouth daily.   levothyroxine (SYNTHROID) 75 MCG tablet Take 1 tablet (75 mcg total) by mouth daily.   lisinopril (ZESTRIL) 40 MG tablet Take 1 tablet (40 mg total) by mouth daily.   loratadine (CLARITIN) 10 MG tablet Take 10 mg by mouth daily.   melatonin 3 MG TABS tablet Take 3 mg by mouth at bedtime.   nicotine (NICODERM CQ - DOSED IN MG/24 HOURS) 14 mg/24hr patch Place 1 patch (14 mg total) onto the skin daily.   Omega-3 Fatty Acids (FISH OIL) 1000 MG CAPS Take 1 capsule by mouth daily.   oxybutynin (DITROPAN-XL) 10 MG 24 hr tablet Take 1  tablet (10 mg total) by mouth at bedtime.   pregabalin (LYRICA) 50 MG capsule Take 1 capsule by mouth twice daily.   Rimegepant Sulfate (NURTEC) 75 MG TBDP Place 1 tablet under the tongue every other day.   traZODone (DESYREL) 50 MG tablet Take 1 tablet (50 mg total) by mouth at bedtime.   trimethoprim (TRIMPEX) 100 MG tablet Take 100 mg by mouth daily.   venlafaxine XR (EFFEXOR-XR) 150 MG 24 hr capsule Take 1 capsule (150 mg total) by mouth daily with breakfast.   No facility-administered encounter medications on file as of 08/26/2023.    Review of Systems:  Review of Systems  Constitutional:  Negative for appetite change, chills, fatigue and fever.  HENT:  Negative for congestion, hearing loss, rhinorrhea and sore throat.   Eyes: Negative.   Respiratory:  Negative for cough, shortness of breath and wheezing.   Cardiovascular:  Negative for chest pain, palpitations and leg swelling.  Gastrointestinal:  Negative for abdominal  pain, constipation, diarrhea, nausea and vomiting.  Genitourinary:  Negative for dysuria.  Musculoskeletal:  Negative for arthralgias, back pain and myalgias.  Skin:  Negative for color change, rash and wound.  Neurological:  Negative for dizziness, weakness and headaches.  Psychiatric/Behavioral:  Negative for behavioral problems. The patient is not nervous/anxious.     Health Maintenance  Topic Date Due   INFLUENZA VACCINE  07/11/2023   COVID-19 Vaccine (6 - 2023-24 season) 08/11/2023   Lung Cancer Screening  01/12/2024   Colonoscopy  02/18/2024   Medicare Annual Wellness (AWV)  03/06/2024   MAMMOGRAM  03/26/2025   DTaP/Tdap/Td (4 - Td or Tdap) 03/14/2032   Pneumonia Vaccine 67+ Years old  Completed   DEXA SCAN  Completed   Hepatitis C Screening  Completed   Zoster Vaccines- Shingrix  Completed   HPV VACCINES  Aged Out    Physical Exam: Vitals:   08/26/23 1518  Height: 5\' 3"  (1.6 m)   Body mass index is 26.93 kg/m. Physical Exam Constitutional:      General: She is not in acute distress.    Appearance: Normal appearance.  HENT:     Head: Normocephalic and atraumatic.     Nose: Nose normal.     Mouth/Throat:     Mouth: Mucous membranes are moist.  Eyes:     Conjunctiva/sclera: Conjunctivae normal.  Cardiovascular:     Rate and Rhythm: Normal rate and regular rhythm.  Pulmonary:     Effort: Pulmonary effort is normal.     Breath sounds: Normal breath sounds.  Abdominal:     General: Bowel sounds are normal.     Palpations: Abdomen is soft.  Musculoskeletal:        General: Normal range of motion.     Cervical back: Normal range of motion.  Skin:    General: Skin is warm and dry.  Neurological:     General: No focal deficit present.     Mental Status: She is alert and oriented to person, place, and time.  Psychiatric:        Mood and Affect: Mood normal.        Behavior: Behavior normal.        Thought Content: Thought content normal.        Judgment:  Judgment normal.     Labs reviewed: Basic Metabolic Panel: Recent Labs    11/09/22 1418 03/28/23 1541 05/08/23 1134 05/09/23 0302  NA 143 142 136 138  K 3.9 4.0 3.5 3.8  CL  104 105 98 101  CO2 29 28 23 23   GLUCOSE 87 84 146* 155*  BUN 14 17 12 14   CREATININE 0.92 0.96 1.06* 1.06*  CALCIUM 9.2 8.9 9.0 8.7*  TSH 0.14* 0.42  --   --    Liver Function Tests: Recent Labs    03/28/23 1541 05/08/23 1134 05/09/23 0302  AST 11 15 13*  ALT 8 11 12   ALKPHOS  --  103 84  BILITOT 0.4 1.3* 0.7  PROT 6.9 7.9 7.1  ALBUMIN  --  4.5 3.8   No results for input(s): "LIPASE", "AMYLASE" in the last 8760 hours. No results for input(s): "AMMONIA" in the last 8760 hours. CBC: Recent Labs    11/09/22 1418 03/28/23 1541 05/08/23 1134 05/09/23 0302  WBC 7.0 10.1 20.2* 15.3*  NEUTROABS 4,473 7,312 18.6*  --   HGB 11.7 12.4 12.2 11.6*  HCT 33.9* 36.3 37.6 35.4*  MCV 92.6 91.7 94.7 94.1  PLT 211 183 207 199   Lipid Panel: No results for input(s): "CHOL", "HDL", "LDLCALC", "TRIG", "CHOLHDL", "LDLDIRECT" in the last 8760 hours. No results found for: "HGBA1C"  Procedures since last visit: No results found.  Assessment/Plan    Labs/tests ordered:  None  Next appt:  09/12/2023

## 2023-09-12 ENCOUNTER — Ambulatory Visit: Payer: Medicare Other | Admitting: Adult Health

## 2023-09-12 VITALS — BP 127/78 | HR 68 | Temp 97.6°F | Resp 18 | Ht 63.0 in | Wt 159.8 lb

## 2023-09-12 DIAGNOSIS — N3281 Overactive bladder: Secondary | ICD-10-CM

## 2023-09-12 DIAGNOSIS — R413 Other amnesia: Secondary | ICD-10-CM

## 2023-09-12 DIAGNOSIS — E559 Vitamin D deficiency, unspecified: Secondary | ICD-10-CM

## 2023-09-12 DIAGNOSIS — I10 Essential (primary) hypertension: Secondary | ICD-10-CM | POA: Diagnosis not present

## 2023-09-12 DIAGNOSIS — Z720 Tobacco use: Secondary | ICD-10-CM

## 2023-09-12 DIAGNOSIS — Z131 Encounter for screening for diabetes mellitus: Secondary | ICD-10-CM

## 2023-09-12 DIAGNOSIS — M810 Age-related osteoporosis without current pathological fracture: Secondary | ICD-10-CM

## 2023-09-12 DIAGNOSIS — F319 Bipolar disorder, unspecified: Secondary | ICD-10-CM

## 2023-09-12 DIAGNOSIS — Z23 Encounter for immunization: Secondary | ICD-10-CM

## 2023-09-12 DIAGNOSIS — E890 Postprocedural hypothyroidism: Secondary | ICD-10-CM

## 2023-09-12 DIAGNOSIS — F5101 Primary insomnia: Secondary | ICD-10-CM

## 2023-09-12 NOTE — Progress Notes (Signed)
[facility] clinic  Provider:  Kenard Gower DNP  Code Status:  Full Code  Goals of Care:     08/26/2023    4:18 PM  Advanced Directives  Does Patient Have a Medical Advance Directive? No  Would patient like information on creating a medical advance directive? No - Patient declined     Chief Complaint  Patient presents with   Follow-up    3 mths    Immunizations    Influenza and Covid    HPI: Patient is a 72 y.o. female seen today for a 21-month follow up of chronic issues. She was accompanied today by her daughter.  Primary hypertension -BP 127/78, takes Lisinopril  Memory loss -  takes Aricept  Tobacco use -  smokes 10 cigarettes/day, smoking for 50 years  Bipolar 1 disorder (HCC) - denies hallucinations, takes Lamictal and Venlafaxine   Postoperative hypothyroidism -takes Levothyroxine   OAB (overactive bladder) -  on Oxybutynin  Primary insomnia -   takes Trazodone   Past Medical History:  Diagnosis Date   Allergy    Alzheimer disease (HCC)    Anxiety    Arthritis    Bipolar 1 disorder (HCC)    Cancer (HCC)    Cataract    CSF leak    Depression    Hyperlipidemia    Hypertension    Neuromuscular disorder (HCC)    Sleep apnea    TBI (traumatic brain injury) (HCC)    Thyroid disease    TIA (transient ischemic attack)    Vitamin B6 deficiency    Vitamin D deficiency     Past Surgical History:  Procedure Laterality Date   ABDOMINAL HYSTERECTOMY     DG SHOULDER RIGHT COMPLETE  2023   RECTOVAGINAL FISTULA CLOSURE     ROTATOR CUFF REPAIR     THYROIDECTOMY  2004    Allergies  Allergen Reactions   Latex Rash   Other Rash    Tape made her have blisters   Wound Dressing Adhesive Rash    Outpatient Encounter Medications as of 09/12/2023  Medication Sig   acetaminophen (TYLENOL) 650 MG CR tablet Take 1,300 mg by mouth every 8 (eight) hours as needed for pain.   albuterol (VENTOLIN HFA) 108 (90 Base) MCG/ACT inhaler Inhale 2 puffs into the  lungs every 6 (six) hours as needed for wheezing or shortness of breath.   ascorbic acid (VITAMIN C) 500 MG tablet Take 500 mg by mouth 2 (two) times daily.   atorvastatin (LIPITOR) 40 MG tablet Take 1 tablet by mouth daily.   butalbital-acetaminophen-caffeine (FIORICET) 50-325-40 MG tablet Take 2 tablets by mouth as needed for headache.   donepezil (ARICEPT) 10 MG tablet Take 1 tablet (10 mg total) by mouth at bedtime.   ferrous sulfate 325 (65 FE) MG tablet Take 1 tablet (325 mg total) by mouth daily.   ibandronate (BONIVA) 150 MG tablet Take 150 mg by mouth every 30 (thirty) days. Take in the morning with a full glass of water, on an empty stomach, and do not take anything else by mouth or lie down for the next 30 min.   ketoconazole (NIZORAL) 2 % shampoo Three times per week, massage into scalp and leave in for 8-10 minutes before rinsing out.   lamoTRIgine (LAMICTAL) 200 MG tablet Take 1 tablet (200 mg total) by mouth daily.   levothyroxine (SYNTHROID) 75 MCG tablet Take 1 tablet (75 mcg total) by mouth daily.   lisinopril (ZESTRIL) 40 MG tablet Take 1  tablet (40 mg total) by mouth daily.   loratadine (CLARITIN) 10 MG tablet Take 10 mg by mouth daily.   melatonin 3 MG TABS tablet Take 3 mg by mouth at bedtime.   nicotine (NICODERM CQ - DOSED IN MG/24 HOURS) 14 mg/24hr patch Place 1 patch (14 mg total) onto the skin daily.   Omega-3 Fatty Acids (FISH OIL) 1000 MG CAPS Take 1 capsule by mouth daily.   oxybutynin (DITROPAN-XL) 10 MG 24 hr tablet Take 1 tablet (10 mg total) by mouth at bedtime.   pregabalin (LYRICA) 50 MG capsule Take 1 capsule by mouth twice daily.   Rimegepant Sulfate (NURTEC) 75 MG TBDP Place 1 tablet under the tongue every other day.   traZODone (DESYREL) 50 MG tablet Take 1 tablet (50 mg total) by mouth at bedtime.   trimethoprim (TRIMPEX) 100 MG tablet Take 100 mg by mouth daily.   venlafaxine XR (EFFEXOR-XR) 150 MG 24 hr capsule Take 1 capsule (150 mg total) by mouth  daily with breakfast.   No facility-administered encounter medications on file as of 09/12/2023.    Review of Systems:  Review of Systems  Constitutional:  Negative for appetite change, chills, fatigue and fever.  HENT:  Negative for congestion, hearing loss, rhinorrhea and sore throat.   Eyes: Negative.   Respiratory:  Negative for cough, shortness of breath and wheezing.   Cardiovascular:  Negative for chest pain, palpitations and leg swelling.  Gastrointestinal:  Negative for abdominal pain, constipation, diarrhea, nausea and vomiting.  Genitourinary:  Negative for dysuria.  Musculoskeletal:  Negative for arthralgias, back pain and myalgias.  Skin:  Negative for color change, rash and wound.  Neurological:  Negative for dizziness, weakness and headaches.  Psychiatric/Behavioral:  Negative for behavioral problems. The patient is not nervous/anxious.     Health Maintenance  Topic Date Due   INFLUENZA VACCINE  07/11/2023   COVID-19 Vaccine (6 - 2023-24 season) 08/11/2023   Lung Cancer Screening  01/12/2024   Colonoscopy  02/18/2024   Medicare Annual Wellness (AWV)  03/06/2024   MAMMOGRAM  03/26/2025   DTaP/Tdap/Td (4 - Td or Tdap) 03/14/2032   Pneumonia Vaccine 89+ Years old  Completed   DEXA SCAN  Completed   Hepatitis C Screening  Completed   Zoster Vaccines- Shingrix  Completed   HPV VACCINES  Aged Out    Physical Exam: Vitals:   09/12/23 1525  BP: 127/78  Pulse: 68  Resp: 18  Temp: 97.6 F (36.4 C)  SpO2: 93%  Weight: 159 lb 12.8 oz (72.5 kg)  Height: 5\' 3"  (1.6 m)   Body mass index is 28.31 kg/m. Physical Exam Constitutional:      General: She is not in acute distress.    Appearance: Normal appearance.  HENT:     Head: Normocephalic and atraumatic.     Nose: Nose normal.     Mouth/Throat:     Mouth: Mucous membranes are moist.  Eyes:     Conjunctiva/sclera: Conjunctivae normal.  Cardiovascular:     Rate and Rhythm: Normal rate and regular rhythm.   Pulmonary:     Effort: Pulmonary effort is normal.     Breath sounds: Normal breath sounds.  Abdominal:     General: Bowel sounds are normal.     Palpations: Abdomen is soft.  Musculoskeletal:        General: Normal range of motion.     Cervical back: Normal range of motion.  Skin:    General: Skin is warm and  dry.  Neurological:     General: No focal deficit present.     Mental Status: She is alert and oriented to person, place, and time.  Psychiatric:        Mood and Affect: Mood normal.        Behavior: Behavior normal.        Thought Content: Thought content normal.        Judgment: Judgment normal.     Labs reviewed: Basic Metabolic Panel: Recent Labs    11/09/22 1418 03/28/23 1541 05/08/23 1134 05/09/23 0302  NA 143 142 136 138  K 3.9 4.0 3.5 3.8  CL 104 105 98 101  CO2 29 28 23 23   GLUCOSE 87 84 146* 155*  BUN 14 17 12 14   CREATININE 0.92 0.96 1.06* 1.06*  CALCIUM 9.2 8.9 9.0 8.7*  TSH 0.14* 0.42  --   --    Liver Function Tests: Recent Labs    03/28/23 1541 05/08/23 1134 05/09/23 0302  AST 11 15 13*  ALT 8 11 12   ALKPHOS  --  103 84  BILITOT 0.4 1.3* 0.7  PROT 6.9 7.9 7.1  ALBUMIN  --  4.5 3.8   No results for input(s): "LIPASE", "AMYLASE" in the last 8760 hours. No results for input(s): "AMMONIA" in the last 8760 hours. CBC: Recent Labs    11/09/22 1418 03/28/23 1541 05/08/23 1134 05/09/23 0302  WBC 7.0 10.1 20.2* 15.3*  NEUTROABS 4,473 7,312 18.6*  --   HGB 11.7 12.4 12.2 11.6*  HCT 33.9* 36.3 37.6 35.4*  MCV 92.6 91.7 94.7 94.1  PLT 211 183 207 199   Lipid Panel: No results for input(s): "CHOL", "HDL", "LDLCALC", "TRIG", "CHOLHDL", "LDLDIRECT" in the last 8760 hours. No results found for: "HGBA1C"  Procedures since last visit: No results found.  Assessment/Plan  1. Flu vaccine need - Flu Vaccine Trivalent High Dose (Fluad)  2. Primary hypertension -  BP stable -  continue Lisinopril - CBC With Differential/Platelet  3.  Memory loss -  continue Aricept  4. Tobacco use -  counseled  5. Bipolar 1 disorder (HCC) -  mood is stable -  continue Lamictal and Venlafaxine -  follows up with psychiatrist - Lipid panel - CBC With Differential/Platelet  6. Age-related osteoporosis without current pathological fracture -  ordered Boniva pending insurance approval  7. Postoperative hypothyroidism -  continue Levothyroxine - TSH  8. Screening for diabetes mellitus - Hemoglobin A1C  9. Vitamin D deficiency - Vitamin D, 25-hydroxy  10. OAB (overactive bladder) -  continue Oxybutynin -  follows up with urology  11. Primary insomnia -  stable -  continue Trazodone     Labs/tests ordered:  - Vitamin D, 25-hydroxy, A1C, tsh, CBC, lipid panel                                         Next appt:  Visit date not found

## 2023-09-13 ENCOUNTER — Other Ambulatory Visit: Payer: Self-pay

## 2023-09-13 LAB — CBC WITH DIFFERENTIAL/PLATELET
Absolute Monocytes: 469 {cells}/uL (ref 200–950)
Basophils Absolute: 41 {cells}/uL (ref 0–200)
Basophils Relative: 0.6 %
Eosinophils Absolute: 110 {cells}/uL (ref 15–500)
Eosinophils Relative: 1.6 %
HCT: 32.2 % — ABNORMAL LOW (ref 35.0–45.0)
Hemoglobin: 10.5 g/dL — ABNORMAL LOW (ref 11.7–15.5)
Lymphs Abs: 1877 {cells}/uL (ref 850–3900)
MCH: 30.6 pg (ref 27.0–33.0)
MCHC: 32.6 g/dL (ref 32.0–36.0)
MCV: 93.9 fL (ref 80.0–100.0)
MPV: 9.3 fL (ref 7.5–12.5)
Monocytes Relative: 6.8 %
Neutro Abs: 4402 {cells}/uL (ref 1500–7800)
Neutrophils Relative %: 63.8 %
Platelets: 246 10*3/uL (ref 140–400)
RBC: 3.43 10*6/uL — ABNORMAL LOW (ref 3.80–5.10)
RDW: 12.9 % (ref 11.0–15.0)
Total Lymphocyte: 27.2 %
WBC: 6.9 10*3/uL (ref 3.8–10.8)

## 2023-09-13 LAB — LIPID PANEL
Cholesterol: 123 mg/dL (ref <200)
HDL: 38 mg/dL — ABNORMAL LOW (ref >=50)
LDL Cholesterol (Calc): 60 mg/dL
Non-HDL Cholesterol (Calc): 85 mg/dL (ref <130)
Total CHOL/HDL Ratio: 3.2 (calc) (ref <5.0)
Triglycerides: 172 mg/dL — ABNORMAL HIGH (ref <150)

## 2023-09-13 LAB — TSH: TSH: 1.2 m[IU]/L (ref 0.40–4.50)

## 2023-09-13 LAB — HEMOGLOBIN A1C
Hgb A1c MFr Bld: 5.7 %{Hb} — ABNORMAL HIGH (ref <5.7)
Mean Plasma Glucose: 117 mg/dL
eAG (mmol/L): 6.5 mmol/L

## 2023-09-13 LAB — VITAMIN D 25 HYDROXY (VIT D DEFICIENCY, FRACTURES): Vit D, 25-Hydroxy: 44 ng/mL (ref 30–100)

## 2023-09-13 MED ORDER — IBANDRONATE SODIUM 150 MG PO TABS: 150.0000 mg | ORAL_TABLET | ORAL | 3 refills | Status: DC

## 2023-09-13 NOTE — Telephone Encounter (Signed)
Refill request received by fax from [facility] for [facility], since we do not have a refill history on this medication I will send to Medina-Vargas, Monina C, NP for review and approval

## 2023-09-15 NOTE — Progress Notes (Signed)
-    A1C 5.7, ranging as prediabetic -  hgb 10.5, down from 11.6, stable -  triglycerides 172, improved from 304 9a year ago), continue Omega 3 fatty acids -  Vitamin D and tsh normal

## 2023-09-16 ENCOUNTER — Ambulatory Visit: Payer: Medicare Other | Admitting: Urology

## 2023-09-21 ENCOUNTER — Other Ambulatory Visit: Payer: Self-pay | Admitting: Adult Health

## 2023-09-21 DIAGNOSIS — M797 Fibromyalgia: Secondary | ICD-10-CM

## 2023-09-23 ENCOUNTER — Other Ambulatory Visit: Payer: Self-pay | Admitting: *Deleted

## 2023-09-23 ENCOUNTER — Encounter: Payer: Self-pay | Admitting: Adult Health

## 2023-09-23 DIAGNOSIS — M797 Fibromyalgia: Secondary | ICD-10-CM

## 2023-09-23 MED ORDER — PREGABALIN 50 MG PO CAPS
50.0000 mg | ORAL_CAPSULE | Freq: Two times a day (BID) | ORAL | 5 refills | Status: DC
Start: 2023-09-23 — End: 2024-02-18

## 2023-09-23 NOTE — Telephone Encounter (Addendum)
Submitted Prior Authorization through Cover My Meds and it stated that this medication does not require a Prior Authorization.   Key: [id number]

## 2023-09-23 NOTE — Telephone Encounter (Signed)
[facility] requested refill.   Pended Rx and sent to [facility] for approval.

## 2023-09-24 ENCOUNTER — Ambulatory Visit: Payer: Medicare Other | Admitting: Dermatology

## 2023-10-21 ENCOUNTER — Other Ambulatory Visit: Payer: Self-pay | Admitting: *Deleted

## 2023-10-21 DIAGNOSIS — E890 Postprocedural hypothyroidism: Secondary | ICD-10-CM

## 2023-10-21 MED ORDER — LEVOTHYROXINE SODIUM 75 MCG PO TABS: 75.0000 ug | ORAL_TABLET | Freq: Every day | ORAL | 1 refills | Status: DC

## 2023-10-21 NOTE — Telephone Encounter (Signed)
[facility] requested refill.

## 2023-11-06 ENCOUNTER — Other Ambulatory Visit: Payer: Self-pay | Admitting: Medical Genetics

## 2023-11-11 ENCOUNTER — Encounter: Payer: Self-pay | Admitting: Urology

## 2023-11-11 ENCOUNTER — Ambulatory Visit: Payer: Medicare Other | Admitting: Urology

## 2023-11-11 VITALS — BP 168/75 | HR 69

## 2023-11-11 DIAGNOSIS — N3946 Mixed incontinence: Secondary | ICD-10-CM

## 2023-11-11 LAB — MICROSCOPIC EXAMINATION

## 2023-11-11 LAB — URINALYSIS, COMPLETE
Bilirubin, UA: NEGATIVE
Glucose, UA: NEGATIVE
Ketones, UA: NEGATIVE
Nitrite, UA: POSITIVE — AB
Protein,UA: NEGATIVE
Specific Gravity, UA: 1.01 (ref 1.005–1.030)
Urobilinogen, Ur: 0.2 mg/dL (ref 0.2–1.0)
pH, UA: 6.5 (ref 5.0–7.5)

## 2023-11-11 MED ORDER — OXYBUTYNIN CHLORIDE ER 10 MG PO TB24: 10.0000 mg | ORAL_TABLET | Freq: Two times a day (BID) | ORAL | 11 refills | Status: DC

## 2023-11-11 NOTE — Progress Notes (Signed)
11/11/2023 2:29 PM   Shannon Hart 1951/01/03 [id number]  Referring provider: Gillis Santa, NP [address] [place],  [place] [address]  Chief Complaint  Patient presents with   Follow-up   Urinary Incontinence    HPI: I was consulted to assess the patient's urinary incontinence.  She primarily has urge incontinence.  She can leak sometimes with coughing sneezing.  She has moderate to severe bedwetting.  She wears 5-8 pads a day moderately wet   She voids every 2 hours gets up to 3 times a night.  Flow was good.   She has had a previous bladder suspension.  It sounds like she may be having a planned bladder repair in [facility] but she is moving to [place] where her daughter lives.  I reviewed the medical records and she is likely had at least 1 prolapse repair and perhaps an abdominal repair with mesh.  She is also been tried on Gemtesa in the past.  I looked at the urology notes from 2023 and they talked her about the 3 refractory OAB therapies and were going to consult for a pessary to gynecology   She has had a TIA and a hysterectomy.  She has a history of traumatic brain injury and or stroke.   She is currently on Myrbetriq and trospium with no benefit.  She has had no other treatments.    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  She has bedwetting frequency and nocturia.  She will return with urodynamics and cystoscopy.  She has neurogenic bladder risk factors.  Call if culture positive.  She has high treatment goals and is hoping to get helped    Today Frequency stable.  No culture from last visit.  She had a positive culture in February 2024 On urodynamics patient did not void and was catheterized for 25 mL.  Maximum bladder capacity was 202 mL.  She had increased bladder sensation.  She had impressive detrusor overactivity reaching pressure 31 cm of water.  She had urgency and leaked a small amount.  No stress  incontinence with Valsalva pressure of 106 cm of water.  During voluntary voiding she voided 17 mL with a maximal flow of 3 mL/s.  Max voiding pressure ranged between 27 and 40 cm of water.  She was generating detrusor contractions with little to no flow in interrupted pattern.  Residual was 185 mL.  Bladder neck descent at less than a centimeter.  No prolapse.     Pelvic examination no stress incontinence or prolapse   Cystoscopy: Patient underwent flexible cystoscopy.  Bladder culture and trigone were normal.  Urine was cloudy with a few white flecks.  No carcinoma.    Recognizing limitations like to see the patient back on oxybutynin ER 10 mg 3o x 11.  I want to see if the culture is positive.  I am suspect that she may be getting some urinary tract infections based upon cystoscopy today and 1 previous positive culture.  We will then discuss refractory treatments recognizing age and some comorbidities.  90% of bladder dysfunction there is bladder overactivity.  Stop Myrbetriq and trospium   The request of her daughter I went over InterStim and PTNS in full detail with templates.  I think they have probably heard this before as noted above.  They said she did not want to do Botox because of the fear retention.  They understand and waiting for urine culture.  Also reassess in 6  weeks on oxybutynin.  Her daughter thinks she tried some of her daughter's pills at 1 time and the oxybutynin worked but they were afraid of CNS side effects.  Relationship with memory discussed.  Continue and reassess on oxybutynin  Today Last urine culture when I saw her in August 2024 was positive.  Urine culture in February 2024 was also positive Patient is approximately 50% improved wearing 2 pads and sometimes 3 pads per day.  Clinically not infected but her daughter says she often gets infection and does not know.  Patient wants to be as dry as possible.  Her and her daughter said she would like to try the sacral nerve  stimulation.  PMH: Past Medical History:  Diagnosis Date   Allergy    Alzheimer disease (HCC)    Anxiety    Arthritis    Bipolar 1 disorder (HCC)    Cancer (HCC)    Cataract    CSF leak    Depression    Hyperlipidemia    Hypertension    Neuromuscular disorder (HCC)    Sleep apnea    TBI (traumatic brain injury) (HCC)    Thyroid disease    TIA (transient ischemic attack)    Vitamin B6 deficiency    Vitamin D deficiency     Surgical History: Past Surgical History:  Procedure Laterality Date   ABDOMINAL HYSTERECTOMY     DG SHOULDER RIGHT COMPLETE  2023   RECTOVAGINAL FISTULA CLOSURE     ROTATOR CUFF REPAIR     THYROIDECTOMY  2004    Home Medications:  Allergies as of 11/11/2023       Reactions   Latex Rash   Other Rash   Tape made her have blisters   Wound Dressing Adhesive Rash        Medication List        Accurate as of November 11, 2023  2:29 PM. If you have any questions, ask your nurse or doctor.          STOP taking these medications    melatonin 3 MG Tabs tablet Stopped by: Lorin Picket A Nevyn Bossman   nicotine 14 mg/24hr patch Commonly known as: NICODERM CQ - dosed in mg/24 hours Stopped by: Lorin Picket A Alahia Whicker       TAKE these medications    acetaminophen 650 MG CR tablet Commonly known as: TYLENOL Take 1,300 mg by mouth every 8 (eight) hours as needed for pain.   albuterol 108 (90 Base) MCG/ACT inhaler Commonly known as: VENTOLIN HFA Inhale 2 puffs into the lungs every 6 (six) hours as needed for wheezing or shortness of breath.   ascorbic acid 500 MG tablet Commonly known as: VITAMIN C Take 500 mg by mouth 2 (two) times daily.   atorvastatin 40 MG tablet Commonly known as: LIPITOR Take 1 tablet by mouth daily.   butalbital-acetaminophen-caffeine 50-325-40 MG tablet Commonly known as: FIORICET Take 2 tablets by mouth as needed for headache.   donepezil 10 MG tablet Commonly known as: ARICEPT Take 1 tablet (10 mg total) by mouth  at bedtime.   ferrous sulfate 325 (65 FE) MG tablet Take 1 tablet (325 mg total) by mouth daily.   Fish Oil 1000 MG Caps Take 1 capsule by mouth daily.   ibandronate 150 MG tablet Commonly known as: BONIVA Take 1 tablet (150 mg total) by mouth every 30 (thirty) days. Take in the morning with a full glass of water, on an empty stomach, and do not take anything else  by mouth or lie down for the next 30 min.   ketoconazole 2 % shampoo Commonly known as: NIZORAL Three times per week, massage into scalp and leave in for 8-10 minutes before rinsing out.   lamoTRIgine 200 MG tablet Commonly known as: LAMICTAL Take 1 tablet (200 mg total) by mouth daily.   levothyroxine 75 MCG tablet Commonly known as: SYNTHROID Take 1 tablet (75 mcg total) by mouth daily.   lisinopril 40 MG tablet Commonly known as: ZESTRIL Take 1 tablet (40 mg total) by mouth daily.   loratadine 10 MG tablet Commonly known as: CLARITIN Take 10 mg by mouth daily.   Nurtec 75 MG Tbdp Generic drug: Rimegepant Sulfate Place 1 tablet under the tongue every other day.   oxybutynin 10 MG 24 hr tablet Commonly known as: DITROPAN-XL Take 1 tablet (10 mg total) by mouth at bedtime.   pregabalin 50 MG capsule Commonly known as: LYRICA Take 1 capsule (50 mg total) by mouth 2 (two) times daily.   traZODone 50 MG tablet Commonly known as: DESYREL Take 1 tablet (50 mg total) by mouth at bedtime.   trimethoprim 100 MG tablet Commonly known as: TRIMPEX Take 100 mg by mouth daily.   venlafaxine XR 150 MG 24 hr capsule Commonly known as: EFFEXOR-XR Take 1 capsule (150 mg total) by mouth daily with breakfast.        Allergies:  Allergies  Allergen Reactions   Latex Rash   Other Rash    Tape made her have blisters   Wound Dressing Adhesive Rash    Family History: Family History  Problem Relation Age of Onset   Colon cancer Neg Hx    Colon polyps Neg Hx    Esophageal cancer Neg Hx    Rectal cancer Neg  Hx    Stomach cancer Neg Hx     Social History:  reports that she has been smoking cigarettes. She has a 25.5 pack-year smoking history. She has been exposed to tobacco smoke. She has never used smokeless tobacco. She reports that she does not currently use alcohol. She reports that she does not currently use drugs.  ROS:                                        Physical Exam: BP (!) 168/75   Pulse 69   Constitutional:  Alert and oriented, No acute distress. HEENT: [place] AT, moist mucus membranes.  Trachea midline, no masses.   Laboratory Data: Lab Results  Component Value Date   WBC 6.9 09/12/2023   HGB 10.5 (L) 09/12/2023   HCT 32.2 (L) 09/12/2023   MCV 93.9 09/12/2023   PLT 246 09/12/2023    Lab Results  Component Value Date   CREATININE 1.06 (H) 05/09/2023    No results found for: "PSA"  No results found for: "TESTOSTERONE"  Lab Results  Component Value Date   HGBA1C 5.7 (H) 09/12/2023    Urinalysis    Component Value Date/Time   COLORURINE YELLOW 05/08/2023 1500   APPEARANCEUR Clear 07/29/2023 1447   LABSPEC 1.015 05/08/2023 1500   PHURINE 6.0 05/08/2023 1500   GLUCOSEU Negative 07/29/2023 1447   HGBUR NEGATIVE 05/08/2023 1500   BILIRUBINUR Negative 07/29/2023 1447   KETONESUR 5 (A) 05/08/2023 1500   PROTEINUR Negative 07/29/2023 1447   PROTEINUR 100 (A) 05/08/2023 1500   NITRITE Positive (A) 07/29/2023 1447   NITRITE NEGATIVE  05/08/2023 1500   LEUKOCYTESUR Trace (A) 07/29/2023 1447   LEUKOCYTESUR NEGATIVE 05/08/2023 1500    Pertinent Imaging: Urine reviewed and sent for culture  Assessment & Plan: Arrange peripheral nerve evaluation.  Site of service discussed.  Call if culture positive.  Recommend for her to stay on the oxybutynin.  It turns out that the patient was put on trimethoprim by [facility] urology.  We agreed that if she gets a positive culture this might be a second breakthrough and I will treat it and switch her prophylaxis  and the daughter agreed.  They want to try oxybutynin ER 10 mg 2 tablets a day or twice a day and they will cancel the peripheral nerve evaluation if it dramatically helps her.  1. Mixed incontinence  - Urinalysis, Complete   No follow-ups on file.  Martina Sinner, MD  [facility] [address], [address] [place], [place] [address] [phone]

## 2023-11-12 ENCOUNTER — Encounter: Payer: Self-pay | Admitting: Urology

## 2023-11-12 ENCOUNTER — Encounter: Payer: Self-pay | Admitting: Adult Health

## 2023-11-13 ENCOUNTER — Other Ambulatory Visit: Payer: Self-pay | Admitting: Medical Genetics

## 2023-11-14 LAB — CULTURE, URINE COMPREHENSIVE

## 2023-11-15 ENCOUNTER — Telehealth: Payer: Self-pay | Admitting: *Deleted

## 2023-11-15 MED ORDER — CIPROFLOXACIN HCL 250 MG PO TABS: 250.0000 mg | ORAL_TABLET | Freq: Two times a day (BID) | ORAL | 0 refills | Status: AC

## 2023-11-15 NOTE — Telephone Encounter (Signed)
Spoke with patient and advised results rx sent to pharmacy by e-script  

## 2023-11-15 NOTE — Telephone Encounter (Signed)
-----   Message from [place] A Macdiarmid sent at 11/15/2023 12:39 PM EST ----- Cipro 250 mg bid for 7 days ----- Message ----- From: Interface, [facility] Lab Results In Sent: 11/11/2023   4:36 PM EST To: Alfredo Martinez, MD

## 2023-11-19 ENCOUNTER — Other Ambulatory Visit: Payer: Self-pay | Admitting: Adult Health

## 2023-11-19 MED ORDER — ALENDRONATE SODIUM 70 MG PO TABS: 70.0000 mg | ORAL_TABLET | ORAL | 11 refills | Status: DC

## 2023-11-20 ENCOUNTER — Other Ambulatory Visit: Payer: Self-pay

## 2023-11-20 DIAGNOSIS — I10 Essential (primary) hypertension: Secondary | ICD-10-CM

## 2023-11-20 MED ORDER — FERROUS SULFATE 325 (65 FE) MG PO TABS: 325.0000 mg | ORAL_TABLET | Freq: Every day | ORAL | 3 refills | Status: DC

## 2023-11-20 MED ORDER — LISINOPRIL 40 MG PO TABS: 40.0000 mg | ORAL_TABLET | Freq: Every day | ORAL | 3 refills | Status: DC

## 2023-11-20 NOTE — Addendum Note (Signed)
Addended by: Maurice Small on: 11/20/2023 11:42 AM   Modules accepted: Orders

## 2023-12-06 ENCOUNTER — Encounter (HOSPITAL_COMMUNITY): Payer: Self-pay

## 2023-12-08 ENCOUNTER — Encounter: Payer: Self-pay | Admitting: Adult Health

## 2023-12-08 DIAGNOSIS — I10 Essential (primary) hypertension: Secondary | ICD-10-CM

## 2023-12-09 MED ORDER — LISINOPRIL 40 MG PO TABS: 40.0000 mg | ORAL_TABLET | Freq: Every day | ORAL | 1 refills | Status: DC

## 2023-12-09 MED ORDER — FERROUS SULFATE 325 (65 FE) MG PO TABS: 325.0000 mg | ORAL_TABLET | Freq: Every day | ORAL | 1 refills | Status: AC

## 2023-12-13 ENCOUNTER — Encounter: Payer: Self-pay | Admitting: Adult Health

## 2023-12-13 ENCOUNTER — Ambulatory Visit (INDEPENDENT_AMBULATORY_CARE_PROVIDER_SITE_OTHER): Payer: Medicare Other | Admitting: Adult Health

## 2023-12-13 VITALS — BP 125/78 | HR 63 | Temp 97.5°F | Resp 18 | Ht 63.0 in | Wt 170.6 lb

## 2023-12-13 DIAGNOSIS — F319 Bipolar disorder, unspecified: Secondary | ICD-10-CM | POA: Diagnosis not present

## 2023-12-13 DIAGNOSIS — F5101 Primary insomnia: Secondary | ICD-10-CM

## 2023-12-13 DIAGNOSIS — Z6830 Body mass index (BMI) 30.0-30.9, adult: Secondary | ICD-10-CM

## 2023-12-13 DIAGNOSIS — E559 Vitamin D deficiency, unspecified: Secondary | ICD-10-CM

## 2023-12-13 DIAGNOSIS — M810 Age-related osteoporosis without current pathological fracture: Secondary | ICD-10-CM | POA: Diagnosis not present

## 2023-12-13 DIAGNOSIS — E890 Postprocedural hypothyroidism: Secondary | ICD-10-CM

## 2023-12-13 DIAGNOSIS — I10 Essential (primary) hypertension: Secondary | ICD-10-CM

## 2023-12-13 DIAGNOSIS — Z720 Tobacco use: Secondary | ICD-10-CM

## 2023-12-13 DIAGNOSIS — N3281 Overactive bladder: Secondary | ICD-10-CM

## 2023-12-13 DIAGNOSIS — R413 Other amnesia: Secondary | ICD-10-CM

## 2023-12-13 DIAGNOSIS — E66811 Obesity, class 1: Secondary | ICD-10-CM

## 2023-12-13 NOTE — Progress Notes (Signed)
 [facility] clinic  Provider: Jereld Serum DNP  Code Status:  Full Code  Goals of Care:     08/26/2023    4:18 PM  Advanced Directives  Does Patient Have a Medical Advance Directive? No  Would patient like information on creating a medical advance directive? No - Patient declined     Chief Complaint  Patient presents with   Medical Management of Chronic Issues     3 months follow-up   Immunizations    Covid   Health Maintenance    Lung Cancer Screening and Colonoscopy    HPI: Patient is a 73 y.o. female seen today for an acute visit for  Postoperative hypothyroidism -  tsh 1.2, takes Levothyroxine   Bipolar 1 disorder (HCC) -  no hallucinations, no agitation, takes Venlafaxine  and Lamictal , follows up with psychiatry  Memory loss -  my memory is excellent, takes Aricept , does not cook nor drive  Age-related osteoporosis without current pathological fracture -  no recent fracture, hasn't started Fosamax   Hypertension -  BP 125/78,  takes Lisinopril   OAB (overactive bladder) -  wears diaper, takes Oxybutynin , will follow up with urology for bladder testing regarding bladder pacemaker on 01/14/24, [facility]  Vitamin D  deficiency-  takes Vitamin D   Primary insomnia -   sleeps 6 hours/night, takes Trazodone  Tobacco use -  10-12 cigarettes/day, smoking since 73 years old   Wt Readings from Last 3 Encounters:  12/13/23 170 lb 9.6 oz (77.4 kg)  09/12/23 159 lb 12.8 oz (72.5 kg)  08/26/23 160 lb 9.6 oz (72.8 kg)     Past Medical History:  Diagnosis Date   Allergy    Alzheimer disease (HCC)    Anxiety    Arthritis    Bipolar 1 disorder (HCC)    Cancer (HCC)    Cataract    CSF leak    Depression    Hyperlipidemia    Hypertension    Neuromuscular disorder (HCC)    Sleep apnea    TBI (traumatic brain injury) (HCC)    Thyroid disease    TIA (transient ischemic attack)    Vitamin B6 deficiency    Vitamin D  deficiency     Past Surgical History:   Procedure Laterality Date   ABDOMINAL HYSTERECTOMY     DG SHOULDER RIGHT COMPLETE  2023   RECTOVAGINAL FISTULA CLOSURE     ROTATOR CUFF REPAIR     THYROIDECTOMY  2004    Allergies  Allergen Reactions   Latex Rash   Other Rash    Tape made her have blisters   Wound Dressing Adhesive Rash    Outpatient Encounter Medications as of 12/13/2023  Medication Sig   acetaminophen  (TYLENOL ) 650 MG CR tablet Take 1,300 mg by mouth every 8 (eight) hours as needed for pain.   albuterol  (VENTOLIN  HFA) 108 (90 Base) MCG/ACT inhaler Inhale 2 puffs into the lungs every 6 (six) hours as needed for wheezing or shortness of breath.   alendronate  (FOSAMAX ) 70 MG tablet Take 1 tablet (70 mg total) by mouth every 7 (seven) days. Take with a full glass of water on an empty stomach.   ascorbic acid (VITAMIN C) 500 MG tablet Take 500 mg by mouth 2 (two) times daily.   atorvastatin  (LIPITOR) 40 MG tablet Take 1 tablet by mouth daily.   butalbital -acetaminophen -caffeine  (FIORICET ) 50-325-40 MG tablet Take 2 tablets by mouth as needed for headache.   donepezil  (ARICEPT ) 10 MG tablet Take 1 tablet (10 mg total) by mouth  at bedtime.   ferrous sulfate  325 (65 FE) MG tablet Take 1 tablet (325 mg total) by mouth daily.   ketoconazole  (NIZORAL ) 2 % shampoo Three times per week, massage into scalp and leave in for 8-10 minutes before rinsing out.   lamoTRIgine  (LAMICTAL ) 200 MG tablet Take 1 tablet (200 mg total) by mouth daily.   levothyroxine  (SYNTHROID ) 75 MCG tablet Take 1 tablet (75 mcg total) by mouth daily.   lisinopril  (ZESTRIL ) 40 MG tablet Take 1 tablet (40 mg total) by mouth daily.   loratadine (CLARITIN) 10 MG tablet Take 10 mg by mouth daily.   Omega-3 Fatty Acids (FISH OIL) 1000 MG CAPS Take 1 capsule by mouth daily.   oxybutynin  (DITROPAN -XL) 10 MG 24 hr tablet Take 1 tablet (10 mg total) by mouth in the morning and at bedtime.   pregabalin  (LYRICA ) 50 MG capsule Take 1 capsule (50 mg total) by mouth 2  (two) times daily.   Rimegepant Sulfate (NURTEC) 75 MG TBDP Place 1 tablet under the tongue every other day.   traZODone  (DESYREL ) 50 MG tablet Take 1 tablet (50 mg total) by mouth at bedtime.   trimethoprim  (TRIMPEX ) 100 MG tablet Take 100 mg by mouth daily.   venlafaxine  XR (EFFEXOR -XR) 150 MG 24 hr capsule Take 1 capsule (150 mg total) by mouth daily with breakfast.   No facility-administered encounter medications on file as of 12/13/2023.    Review of Systems:  Review of Systems  Constitutional:  Negative for appetite change, chills, fatigue and fever.  HENT:  Negative for congestion, hearing loss, rhinorrhea and sore throat.   Eyes: Negative.   Respiratory:  Negative for cough, shortness of breath and wheezing.   Cardiovascular:  Negative for chest pain, palpitations and leg swelling.  Gastrointestinal:  Negative for abdominal pain, constipation, diarrhea, nausea and vomiting.  Genitourinary:  Negative for dysuria.  Musculoskeletal:  Negative for arthralgias, back pain and myalgias.  Skin:  Negative for color change, rash and wound.  Neurological:  Negative for dizziness, weakness and headaches.  Psychiatric/Behavioral:  Negative for behavioral problems. The patient is not nervous/anxious.     Health Maintenance  Topic Date Due   COVID-19 Vaccine (6 - 2024-25 season) 08/11/2023   Lung Cancer Screening  01/12/2024   Colonoscopy  02/18/2024   Medicare Annual Wellness (AWV)  03/06/2024   MAMMOGRAM  03/26/2025   DTaP/Tdap/Td (4 - Td or Tdap) 03/14/2032   Pneumonia Vaccine 104+ Years old  Completed   INFLUENZA VACCINE  Completed   DEXA SCAN  Completed   Hepatitis C Screening  Completed   Zoster Vaccines- Shingrix  Completed   HPV VACCINES  Aged Out    Physical Exam: Vitals:   12/13/23 1512  BP: 125/78  Pulse: 63  Resp: 18  Temp: (!) 97.5 F (36.4 C)  SpO2: 95%  Weight: 170 lb 9.6 oz (77.4 kg)  Height: 5' 3 (1.6 m)   Body mass index is 30.22 kg/m. Physical  Exam Constitutional:      Appearance: She is obese.  HENT:     Head: Normocephalic and atraumatic.     Nose: Nose normal.     Mouth/Throat:     Mouth: Mucous membranes are moist.  Eyes:     Conjunctiva/sclera: Conjunctivae normal.  Cardiovascular:     Rate and Rhythm: Normal rate and regular rhythm.  Pulmonary:     Effort: Pulmonary effort is normal.     Breath sounds: Normal breath sounds.  Abdominal:  General: Bowel sounds are normal.     Palpations: Abdomen is soft.  Musculoskeletal:        General: Normal range of motion.     Cervical back: Normal range of motion.  Skin:    General: Skin is warm and dry.  Neurological:     General: No focal deficit present.     Mental Status: She is alert and oriented to person, place, and time.  Psychiatric:        Mood and Affect: Mood normal.        Behavior: Behavior normal.        Thought Content: Thought content normal.        Judgment: Judgment normal.     Labs reviewed: Basic Metabolic Panel: Recent Labs    03/28/23 1541 05/08/23 1134 05/09/23 0302 09/12/23 1614  NA 142 136 138  --   K 4.0 3.5 3.8  --   CL 105 98 101  --   CO2 28 23 23   --   GLUCOSE 84 146* 155*  --   BUN 17 12 14   --   CREATININE 0.96 1.06* 1.06*  --   CALCIUM  8.9 9.0 8.7*  --   TSH 0.42  --   --  1.20   Liver Function Tests: Recent Labs    03/28/23 1541 05/08/23 1134 05/09/23 0302  AST 11 15 13*  ALT 8 11 12   ALKPHOS  --  103 84  BILITOT 0.4 1.3* 0.7  PROT 6.9 7.9 7.1  ALBUMIN  --  4.5 3.8   No results for input(s): LIPASE, AMYLASE in the last 8760 hours. No results for input(s): AMMONIA in the last 8760 hours. CBC: Recent Labs    03/28/23 1541 05/08/23 1134 05/09/23 0302 09/12/23 1614  WBC 10.1 20.2* 15.3* 6.9  NEUTROABS 7,312 18.6*  --  4,402  HGB 12.4 12.2 11.6* 10.5*  HCT 36.3 37.6 35.4* 32.2*  MCV 91.7 94.7 94.1 93.9  PLT 183 207 199 246   Lipid Panel: Recent Labs    09/12/23 1614  CHOL 123  HDL 38*   LDLCALC 60  TRIG 172*  CHOLHDL 3.2   Lab Results  Component Value Date   HGBA1C 5.7 (H) 09/12/2023    Procedures since last visit: No results found.  Assessment/Plan  1. Postoperative hypothyroidism (Primary) Lab Results  Component Value Date   TSH 1.20 09/12/2023    -  continue Levothyroxine   2. Bipolar 1 disorder (HCC) -  mood is stable -   continue  Lamictal  and Venlafaxne  3. Memory loss -   continue Aricept  -  daughter plans to get her a golf cart so [facility]  4. Age-related osteoporosis without current pathological fracture -   awaiting on Fosamax  from [employer] pillpack  5. Primary hypertension -  BP stable -  continue Lisinopril   6. OAB (overactive bladder) -  wears diaper daily -  continue Oxybutynin  -  follows up with urology  7. Vitamin D  deficiency Last vitamin D  Lab Results  Component Value Date   VD25OH 44 09/12/2023    -  continue vitamin D  supplementation  8. Primary insomnia -  stable -  continue Trazodone   9. Class 1 obesity with body mass index (BMI) of 30.0 to 30.9 in adult, unspecified obesity type, unspecified whether serious comorbidity present Body mass index is 30.22 kg/m.  -  counseled on diet and exercise  10. Tobacco use -  smokes 10-12 cigarettes/day -  counseled   Labs/tests ordered:  None, will get get fasting labs and CT lung next visit  Next appt:  Visit date not found

## 2023-12-20 ENCOUNTER — Other Ambulatory Visit: Payer: Self-pay | Admitting: Adult Health

## 2023-12-20 ENCOUNTER — Other Ambulatory Visit: Payer: Self-pay | Admitting: Neurology

## 2023-12-23 ENCOUNTER — Other Ambulatory Visit: Payer: Self-pay

## 2023-12-23 MED ORDER — DONEPEZIL HCL 10 MG PO TABS: 10.0000 mg | ORAL_TABLET | Freq: Every day | ORAL | 2 refills | Status: DC

## 2024-01-07 ENCOUNTER — Encounter: Payer: Self-pay | Admitting: Neurology

## 2024-01-07 ENCOUNTER — Encounter (HOSPITAL_COMMUNITY): Payer: Self-pay

## 2024-01-08 ENCOUNTER — Other Ambulatory Visit (HOSPITAL_COMMUNITY): Payer: Self-pay | Admitting: *Deleted

## 2024-01-08 DIAGNOSIS — F319 Bipolar disorder, unspecified: Secondary | ICD-10-CM

## 2024-01-08 MED ORDER — DONEPEZIL HCL 10 MG PO TABS: 10.0000 mg | ORAL_TABLET | Freq: Every day | ORAL | 2 refills | Status: DC

## 2024-01-08 MED ORDER — LAMOTRIGINE 200 MG PO TABS: 200.0000 mg | ORAL_TABLET | Freq: Every day | ORAL | 0 refills | Status: DC

## 2024-01-13 ENCOUNTER — Encounter: Payer: Self-pay | Admitting: Adult Health

## 2024-01-13 ENCOUNTER — Ambulatory Visit (INDEPENDENT_AMBULATORY_CARE_PROVIDER_SITE_OTHER): Payer: Medicare Other | Admitting: Adult Health

## 2024-01-13 VITALS — BP 132/78 | HR 74 | Temp 97.4°F | Resp 18 | Ht 63.0 in | Wt 166.4 lb

## 2024-01-13 DIAGNOSIS — I10 Essential (primary) hypertension: Secondary | ICD-10-CM | POA: Diagnosis not present

## 2024-01-13 DIAGNOSIS — R413 Other amnesia: Secondary | ICD-10-CM

## 2024-01-13 DIAGNOSIS — E890 Postprocedural hypothyroidism: Secondary | ICD-10-CM

## 2024-01-13 DIAGNOSIS — N3281 Overactive bladder: Secondary | ICD-10-CM | POA: Diagnosis not present

## 2024-01-13 DIAGNOSIS — E559 Vitamin D deficiency, unspecified: Secondary | ICD-10-CM

## 2024-01-13 DIAGNOSIS — F5101 Primary insomnia: Secondary | ICD-10-CM

## 2024-01-13 DIAGNOSIS — R21 Rash and other nonspecific skin eruption: Secondary | ICD-10-CM

## 2024-01-13 DIAGNOSIS — Z1231 Encounter for screening mammogram for malignant neoplasm of breast: Secondary | ICD-10-CM

## 2024-01-13 DIAGNOSIS — R7303 Prediabetes: Secondary | ICD-10-CM

## 2024-01-13 DIAGNOSIS — M810 Age-related osteoporosis without current pathological fracture: Secondary | ICD-10-CM

## 2024-01-13 DIAGNOSIS — F319 Bipolar disorder, unspecified: Secondary | ICD-10-CM

## 2024-01-13 DIAGNOSIS — Z1211 Encounter for screening for malignant neoplasm of colon: Secondary | ICD-10-CM

## 2024-01-13 DIAGNOSIS — E781 Pure hyperglyceridemia: Secondary | ICD-10-CM

## 2024-01-13 DIAGNOSIS — Z122 Encounter for screening for malignant neoplasm of respiratory organs: Secondary | ICD-10-CM

## 2024-01-13 DIAGNOSIS — D509 Iron deficiency anemia, unspecified: Secondary | ICD-10-CM

## 2024-01-13 MED ORDER — TRIAMCINOLONE ACETONIDE 0.1 % EX CREA: 1.0000 | TOPICAL_CREAM | Freq: Two times a day (BID) | CUTANEOUS | 3 refills | Status: DC | PRN

## 2024-01-13 NOTE — Progress Notes (Unsigned)
[facility] clinic  Provider:   Code Status: *** Goals of Care:     08/26/2023    4:18 PM  Advanced Directives  Does Patient Have a Medical Advance Directive? No  Would patient like information on creating a medical advance directive? No - Patient declined     Chief Complaint  Patient presents with   Medical Management of Chronic Issues    4 weeks follow-up   Health Maintenance    Medicare Annual Wellness Visit, Lung Cancer Screening and Colonoscopy   Immunizations    Covid    HPI: Patient is a 73 y.o. female seen today for an acute visit for  Past Medical History:  Diagnosis Date   Allergy    Alzheimer disease (HCC)    Anxiety    Arthritis    Bipolar 1 disorder (HCC)    Cancer (HCC)    Cataract    CSF leak    Depression    Hyperlipidemia    Hypertension    Neuromuscular disorder (HCC)    Sleep apnea    TBI (traumatic brain injury) (HCC)    Thyroid disease    TIA (transient ischemic attack)    Vitamin B6 deficiency    Vitamin D deficiency     Past Surgical History:  Procedure Laterality Date   ABDOMINAL HYSTERECTOMY     DG SHOULDER RIGHT COMPLETE  2023   RECTOVAGINAL FISTULA CLOSURE     ROTATOR CUFF REPAIR     THYROIDECTOMY  2004    Allergies  Allergen Reactions   Latex Rash   Other Rash    Tape made her have blisters   Wound Dressing Adhesive Rash    Outpatient Encounter Medications as of 01/13/2024  Medication Sig   acetaminophen (TYLENOL) 650 MG CR tablet Take 1,300 mg by mouth every 8 (eight) hours as needed for pain.   albuterol (VENTOLIN HFA) 108 (90 Base) MCG/ACT inhaler Inhale 2 puffs into the lungs every 6 (six) hours as needed for wheezing or shortness of breath.   alendronate (FOSAMAX) 70 MG tablet Take 1 tablet (70 mg total) by mouth every 7 (seven) days. Take with a full glass of water on an empty stomach.   ascorbic acid (VITAMIN C) 500 MG tablet Take 500 mg by mouth 2 (two) times daily.   atorvastatin (LIPITOR) 40 MG tablet Take 1 tablet  by mouth daily.   butalbital-acetaminophen-caffeine (FIORICET) 50-325-40 MG tablet Take 2 tablets by mouth as needed for headache.   donepezil (ARICEPT) 10 MG tablet Take 1 tablet (10 mg total) by mouth at bedtime.   ferrous sulfate 325 (65 FE) MG tablet Take 1 tablet (325 mg total) by mouth daily.   ketoconazole (NIZORAL) 2 % shampoo Three times per week, massage into scalp and leave in for 8-10 minutes before rinsing out.   lamoTRIgine (LAMICTAL) 200 MG tablet Take 1 tablet (200 mg total) by mouth daily.   levothyroxine (SYNTHROID) 75 MCG tablet Take 1 tablet (75 mcg total) by mouth daily.   lisinopril (ZESTRIL) 40 MG tablet Take 1 tablet (40 mg total) by mouth daily.   loratadine (CLARITIN) 10 MG tablet Take 10 mg by mouth daily.   Omega-3 Fatty Acids (FISH OIL) 1000 MG CAPS Take 1 capsule by mouth daily.   oxybutynin (DITROPAN-XL) 10 MG 24 hr tablet Take 1 tablet (10 mg total) by mouth in the morning and at bedtime.   pregabalin (LYRICA) 50 MG capsule Take 1 capsule (50 mg total) by mouth 2 (two)  times daily.   Rimegepant Sulfate (NURTEC) 75 MG TBDP Place 1 tablet under the tongue every other day.   traZODone (DESYREL) 50 MG tablet Take 1 tablet (50 mg total) by mouth at bedtime.   trimethoprim (TRIMPEX) 100 MG tablet Take 100 mg by mouth daily.   venlafaxine XR (EFFEXOR-XR) 150 MG 24 hr capsule Take 1 capsule (150 mg total) by mouth daily with breakfast.   No facility-administered encounter medications on file as of 01/13/2024.    Review of Systems:  Review of Systems  Health Maintenance  Topic Date Due   COVID-19 Vaccine (6 - 2024-25 season) 08/11/2023   Lung Cancer Screening  01/12/2024   Colonoscopy  02/18/2024   Medicare Annual Wellness (AWV)  03/06/2024   MAMMOGRAM  03/26/2025   DTaP/Tdap/Td (4 - Td or Tdap) 03/14/2032   Pneumonia Vaccine 31+ Years old  Completed   INFLUENZA VACCINE  Completed   DEXA SCAN  Completed   Hepatitis C Screening  Completed   Zoster Vaccines-  Shingrix  Completed   HPV VACCINES  Aged Out    Physical Exam: Vitals:   01/13/24 1050  BP: 132/78  Pulse: 74  Resp: 18  Temp: (!) 97.4 F (36.3 C)  SpO2: 96%  Weight: 166 lb 6.4 oz (75.5 kg)  Height: 5\' 3"  (1.6 m)   Body mass index is 29.48 kg/m. Physical Exam  Labs reviewed: Basic Metabolic Panel: Recent Labs    03/28/23 1541 05/08/23 1134 05/09/23 0302 09/12/23 1614  NA 142 136 138  --   K 4.0 3.5 3.8  --   CL 105 98 101  --   CO2 28 23 23   --   GLUCOSE 84 146* 155*  --   BUN 17 12 14   --   CREATININE 0.96 1.06* 1.06*  --   CALCIUM 8.9 9.0 8.7*  --   TSH 0.42  --   --  1.20   Liver Function Tests: Recent Labs    03/28/23 1541 05/08/23 1134 05/09/23 0302  AST 11 15 13*  ALT 8 11 12   ALKPHOS  --  103 84  BILITOT 0.4 1.3* 0.7  PROT 6.9 7.9 7.1  ALBUMIN  --  4.5 3.8   No results for input(s): "LIPASE", "AMYLASE" in the last 8760 hours. No results for input(s): "AMMONIA" in the last 8760 hours. CBC: Recent Labs    03/28/23 1541 05/08/23 1134 05/09/23 0302 09/12/23 1614  WBC 10.1 20.2* 15.3* 6.9  NEUTROABS 7,312 18.6*  --  4,402  HGB 12.4 12.2 11.6* 10.5*  HCT 36.3 37.6 35.4* 32.2*  MCV 91.7 94.7 94.1 93.9  PLT 183 207 199 246   Lipid Panel: Recent Labs    09/12/23 1614  CHOL 123  HDL 38*  LDLCALC 60  TRIG 517*  CHOLHDL 3.2   Lab Results  Component Value Date   HGBA1C 5.7 (H) 09/12/2023    Procedures since last visit: No results found.  Assessment/Plan    Labs/tests ordered:   Next appt:  Visit date not found

## 2024-01-14 ENCOUNTER — Encounter (INDEPENDENT_AMBULATORY_CARE_PROVIDER_SITE_OTHER): Payer: Self-pay

## 2024-01-14 LAB — CBC WITH DIFFERENTIAL/PLATELET
Absolute Lymphocytes: 2231 {cells}/uL (ref 850–3900)
Absolute Monocytes: 370 {cells}/uL (ref 200–950)
Basophils Absolute: 53 {cells}/uL (ref 0–200)
Basophils Relative: 0.8 %
Eosinophils Absolute: 119 {cells}/uL (ref 15–500)
Eosinophils Relative: 1.8 %
HCT: 38.6 % (ref 35.0–45.0)
Hemoglobin: 12.8 g/dL (ref 11.7–15.5)
MCH: 31.1 pg (ref 27.0–33.0)
MCHC: 33.2 g/dL (ref 32.0–36.0)
MCV: 93.9 fL (ref 80.0–100.0)
MPV: 9.8 fL (ref 7.5–12.5)
Monocytes Relative: 5.6 %
Neutro Abs: 3828 {cells}/uL (ref 1500–7800)
Neutrophils Relative %: 58 %
Platelets: 202 10*3/uL (ref 140–400)
RBC: 4.11 10*6/uL (ref 3.80–5.10)
RDW: 13.3 % (ref 11.0–15.0)
Total Lymphocyte: 33.8 %
WBC: 6.6 10*3/uL (ref 3.8–10.8)

## 2024-01-14 LAB — LIPID PANEL
Cholesterol: 153 mg/dL (ref <200)
HDL: 42 mg/dL — ABNORMAL LOW (ref >=50)
LDL Cholesterol (Calc): 74 mg/dL
Non-HDL Cholesterol (Calc): 111 mg/dL (ref <130)
Total CHOL/HDL Ratio: 3.6 (calc) (ref <5.0)
Triglycerides: 279 mg/dL — ABNORMAL HIGH (ref <150)

## 2024-01-14 LAB — HEMOGLOBIN A1C
Hgb A1c MFr Bld: 5.6 %{Hb} (ref <5.7)
Mean Plasma Glucose: 114 mg/dL
eAG (mmol/L): 6.3 mmol/L

## 2024-01-14 LAB — BASIC METABOLIC PANEL WITH GFR
BUN/Creatinine Ratio: 15 (calc) (ref 6–22)
BUN: 16 mg/dL (ref 7–25)
CO2: 27 mmol/L (ref 20–32)
Calcium: 9.1 mg/dL (ref 8.6–10.4)
Chloride: 103 mmol/L (ref 98–110)
Creat: 1.06 mg/dL — ABNORMAL HIGH (ref 0.60–1.00)
Glucose, Bld: 86 mg/dL (ref 65–99)
Potassium: 4.4 mmol/L (ref 3.5–5.3)
Sodium: 139 mmol/L (ref 135–146)
eGFR: 56 mL/min/{1.73_m2} — ABNORMAL LOW (ref >=60)

## 2024-01-14 LAB — TSH: TSH: 5.15 m[IU]/L — ABNORMAL HIGH (ref 0.40–4.50)

## 2024-01-16 ENCOUNTER — Encounter: Payer: Self-pay | Admitting: Gastroenterology

## 2024-01-19 ENCOUNTER — Other Ambulatory Visit: Payer: Self-pay | Admitting: Adult Health

## 2024-01-19 MED ORDER — LEVOTHYROXINE SODIUM 88 MCG PO TABS: 88.0000 ug | ORAL_TABLET | Freq: Every day | ORAL | 3 refills | Status: DC

## 2024-01-20 NOTE — Telephone Encounter (Signed)
 High Risk Warning Populated when attempting to refill, I will send to Provider for further review

## 2024-01-27 ENCOUNTER — Other Ambulatory Visit (HOSPITAL_COMMUNITY): Payer: Self-pay

## 2024-01-27 DIAGNOSIS — F319 Bipolar disorder, unspecified: Secondary | ICD-10-CM

## 2024-01-27 MED ORDER — LAMOTRIGINE 200 MG PO TABS: 200.0000 mg | ORAL_TABLET | Freq: Every day | ORAL | 1 refills | Status: DC

## 2024-01-28 ENCOUNTER — Ambulatory Visit (HOSPITAL_COMMUNITY): Payer: Medicare Other | Admitting: Psychiatry

## 2024-02-04 ENCOUNTER — Other Ambulatory Visit: Payer: Self-pay

## 2024-02-04 ENCOUNTER — Encounter: Payer: Self-pay | Admitting: Urology

## 2024-02-04 ENCOUNTER — Encounter: Payer: Self-pay | Admitting: Adult Health

## 2024-02-04 DIAGNOSIS — R21 Rash and other nonspecific skin eruption: Secondary | ICD-10-CM

## 2024-02-04 MED ORDER — TRIMETHOPRIM 100 MG PO TABS: 100.0000 mg | ORAL_TABLET | Freq: Every day | ORAL | 4 refills | Status: AC

## 2024-02-18 ENCOUNTER — Other Ambulatory Visit: Payer: Self-pay | Admitting: Adult Health

## 2024-02-18 DIAGNOSIS — M797 Fibromyalgia: Secondary | ICD-10-CM

## 2024-02-18 DIAGNOSIS — R21 Rash and other nonspecific skin eruption: Secondary | ICD-10-CM

## 2024-02-18 NOTE — Telephone Encounter (Signed)
Pharmacy requested refill.  Pended Rx's and sent to [facility] for approval.

## 2024-03-03 ENCOUNTER — Encounter: Payer: Self-pay | Admitting: Adult Health

## 2024-03-05 ENCOUNTER — Encounter (HOSPITAL_COMMUNITY): Payer: Self-pay

## 2024-03-05 ENCOUNTER — Telehealth: Payer: Self-pay | Admitting: *Deleted

## 2024-03-05 NOTE — Telephone Encounter (Signed)
 Pt has a hx of Alzheimer's.  I spoke with her daughter, Shanda Bumps.  She will be with pt for PV to help with prep.

## 2024-03-09 ENCOUNTER — Other Ambulatory Visit: Payer: Medicare Other

## 2024-03-09 DIAGNOSIS — E890 Postprocedural hypothyroidism: Secondary | ICD-10-CM

## 2024-03-09 NOTE — Progress Notes (Unsigned)
 BH MD/PA/NP OP Progress Note  03/10/2024 2:23 PM Shannon Hart  MRN:  [id number]  Visit Diagnosis:    ICD-10-CM   1. Bipolar 1 disorder (HCC)  F31.9 lamoTRIgine (LAMICTAL) 200 MG tablet    2. Primary insomnia  F51.01 traZODone (DESYREL) 50 MG tablet      Assessment: Shannon Hart is a 73 y.o. female with a history of MDD, GAD, OCPD, bipolar disorder, Alzheimer's, TBI, OSA, and vitamin D deficiency who presented to [facility] at [facility] for initial evaluation on 04/10/2023.    At initial evaluation patient reported a history of bipolar disorder with manic episodes where she has decreased sleep, racing thoughts, flight of ideas, reckless behaviors, and pressured speech which can last 3-4 days. She denies any delusions, hallucinations, paranoia, or grandiosity.  During the depressive phases patient has low mood, decreased energy, amotivation, anhedonia, and at times passive SI.  She denies any SI since 2020 when she was hospitalized.  At initial evaluation patient was having mild fluctuations between high and low phases where patient was more energetic during the up phases and spent more time in bed during the down phases.  In addition to the diagnosis of bipolar disorder patient also has past history of a TBI following oxygen 2006 and has had signs of memory loss over the past several years.  There has been concern for Alzheimer's and frontotemporal dementia based off of imaging.  Most recent MoCA was a 23 out of 30 on 01/17/23.  Shannon Hart presents for follow-up evaluation. Today, 03/10/24, patient reports that mood has been stable in the interim and that she is being compliant with Lamictal and venlafaxine.  Of note she also reported compliance with trazodone however but it had not been filled since her initial appointment.  Patient does have some difficulties with memory and recall.  We expressed the importance of continuing to take the Lamictal and venlafaxine consistently due to  potential withdrawal/need to titrate the dose back up to its previous levels.  Also explained the importance of following up for appointments in a regular manner.  We will continue on her current regimen with the trazodone being restarted and follow up in 3 months.  Plan: - Continue Lamictal 200 mg QD - Continue Venlafaxine XR 150 mg QD - Continue Trazodone 50 mg - Continue donepezil 10 mg QHS - Continue pregabalin 50 mg BID managed by PCP - CMP, CBC, B12, and TSH reviewed - Head CT w/o contrast from 07/12/22 reviewed and showed moderate cerebral atrophy, concentrated at the frontal, and temporal greater than posterior parietal lobes. 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  - Crisis resources reviewed - Follow up in 3 months   Chief Complaint:  Chief Complaint  Patient presents with   Follow-up   HPI: Patient was last seen a year ago. She reports that she is doing good, had a surgery on her bladder in the interim.    Mood wise she reports that things have been stable and that she is taking the venlafaxine and Lamictal consistently.  When she does get down patient reports that she can start a project to help her mood back up.  Patient had reported that she was also taking the trazodone consistently though we reviewed that this medication has not been filled since her appointment back in May 2024.  That being the case is unlikely she is consistently taking the medication.  Patient's daughter did join the session briefly to confirm that her mother has not taken  the trazodone.  Perhaps due to this sleep has not been as regulated with patient waking up after a few hours.  She will typically have a slice of high and then go back to bed.  We discussed restarting the trazodone which patient was agreeable to.  Past Psychiatric History: Patient had been connected with a psychiatric provider at [facility] in the past over 20 years ago.  She also reports having a therapist in the past but none currently.   Patient reports that she has been hospitalized in December of 2020 for suicidal ideation.  She reports a suicide attempt at the end of 2020 and the beginning of 2021.  Patient has tried Mirtazapine - not helpful for anxiety/depression, Lexapro, Celexa, Depakote - ineffective, Wellbutrin- ineffective, Abilify - akathesia, Seroquel - ineffective, Geodon, Prozac - ineffective, Cymbalta - caused hallucinations, Provigil, and gabapentin in the past.  Zyprexa had been discussed, but patient and family do not want this 2/2 risk of weight gain and side effects.   Lamictal has been extremely effective for her daughter.   Patient denies any substance use other than nicotine.  She reports smoking half a pack a day since she was 22.  Past Medical History:  Past Medical History:  Diagnosis Date   Allergy    Alzheimer disease (HCC)    Anxiety    Arthritis    Bipolar 1 disorder (HCC)    Cancer (HCC)    Cataract    CSF leak    Depression    Hyperlipidemia    Hypertension    Neuromuscular disorder (HCC)    Sleep apnea    TBI (traumatic brain injury) (HCC)    Thyroid disease    TIA (transient ischemic attack)    Vitamin B6 deficiency    Vitamin D deficiency     Past Surgical History:  Procedure Laterality Date   ABDOMINAL HYSTERECTOMY     DG SHOULDER RIGHT COMPLETE  2023   RECTOVAGINAL FISTULA CLOSURE     ROTATOR CUFF REPAIR     THYROIDECTOMY  2004   Family History:  Family History  Problem Relation Age of Onset   Colon cancer Neg Hx    Colon polyps Neg Hx    Esophageal cancer Neg Hx    Rectal cancer Neg Hx    Stomach cancer Neg Hx     Social History:  Social History   Socioeconomic History   Marital status: Divorced    Spouse name: Not on file   Number of children: Not on file   Years of education: Not on file   Highest education level: Some college, no degree  Occupational History   Not on file  Tobacco Use   Smoking status: Every Day    Current packs/day: 0.50     Average packs/day: 0.5 packs/day for 51.0 years (25.5 ttl pk-yrs)    Types: Cigarettes    Passive exposure: Current   Smokeless tobacco: Never  Substance and Sexual Activity   Alcohol use: Not Currently   Drug use: Not Currently   Sexual activity: Not Currently  Other Topics Concern   Not on file  Social History Narrative   Not on file   Social Drivers of Health   Financial Resource Strain: Low Risk  (09/12/2023)   Overall Financial Resource Strain (CARDIA)    Difficulty of Paying Living Expenses: Not very hard  Food Insecurity: Food Insecurity Present (09/12/2023)   Hunger Vital Sign    Worried About Radiation protection practitioner of Food  in the Last Year: Sometimes true    Ran Out of Food in the Last Year: Sometimes true  Transportation Needs: Unmet Transportation Needs (09/12/2023)   PRAPARE - Administrator, Civil Service (Medical): Yes    Lack of Transportation (Non-Medical): No  Physical Activity: Unknown (09/12/2023)   Exercise Vital Sign    Days of Exercise per Week: Patient declined    Minutes of Exercise per Session: Not on file  Stress: Patient Declined (09/12/2023)   [employer] of Occupational Health - Occupational Stress Questionnaire    Feeling of Stress : Patient declined  Social Connections: Socially Isolated (09/12/2023)   Social Connection and Isolation Panel [NHANES]    Frequency of Communication with Friends and Family: Three times a week    Frequency of Social Gatherings with Friends and Family: Once a week    Attends Religious Services: Never    Database administrator or Organizations: No    Attends Engineer, structural: Not on file    Marital Status: Divorced    Allergies:  Allergies  Allergen Reactions   Latex Rash   Other Rash    Tape made her have blisters   Wound Dressing Adhesive Rash    Current Medications: Current Outpatient Medications  Medication Sig Dispense Refill   acetaminophen (TYLENOL) 650 MG CR tablet Take 1,300 mg by  mouth every 8 (eight) hours as needed for pain.     albuterol (VENTOLIN HFA) 108 (90 Base) MCG/ACT inhaler Inhale 2 puffs into the lungs every 6 (six) hours as needed for wheezing or shortness of breath. 8 g 0   alendronate (FOSAMAX) 70 MG tablet Take 1 tablet (70 mg total) by mouth every 7 (seven) days. Take with a full glass of water on an empty stomach. 4 tablet 11   ascorbic acid (VITAMIN C) 500 MG tablet Take 500 mg by mouth 2 (two) times daily.     atorvastatin (LIPITOR) 40 MG tablet Take 1 tablet by mouth daily. 90 tablet 1   butalbital-acetaminophen-caffeine (FIORICET) 50-325-40 MG tablet Take 2 tablets by mouth as needed for headache. 20 tablet 0   donepezil (ARICEPT) 10 MG tablet Take 1 tablet (10 mg total) by mouth at bedtime. 90 tablet 2   ferrous sulfate 325 (65 FE) MG tablet Take 1 tablet (325 mg total) by mouth daily. 90 tablet 1   ketoconazole (NIZORAL) 2 % shampoo Three times per week, massage into scalp and leave in for 8-10 minutes before rinsing out. 120 mL 11   lamoTRIgine (LAMICTAL) 200 MG tablet Take 1 tablet (200 mg total) by mouth daily. 90 tablet 0   levothyroxine (SYNTHROID) 88 MCG tablet Take 1 tablet (88 mcg total) by mouth daily. 90 tablet 3   lisinopril (ZESTRIL) 40 MG tablet Take 1 tablet (40 mg total) by mouth daily. 90 tablet 1   loratadine (CLARITIN) 10 MG tablet Take 10 mg by mouth daily.     Omega-3 Fatty Acids (FISH OIL) 1000 MG CAPS Take 1 capsule by mouth daily.     oxybutynin (DITROPAN-XL) 10 MG 24 hr tablet Take 1 tablet (10 mg total) by mouth in the morning and at bedtime. 60 tablet 11   oxyCODONE-acetaminophen (PERCOCET/ROXICET) 5-325 MG tablet Take 1-2 tablets by mouth every 8 (eight) hours as needed.     pregabalin (LYRICA) 50 MG capsule Take 1 capsule by mouth twice daily. 60 capsule 4   Rimegepant Sulfate (NURTEC) 75 MG TBDP Place 1 tablet under the tongue  every other day.     traZODone (DESYREL) 50 MG tablet Take 1 tablet (50 mg total) by mouth at  bedtime. 90 tablet 0   triamcinolone cream (KENALOG) 0.1 % Apply 1 application topically twice daily as needed. 30 g 2   trimethoprim (TRIMPEX) 100 MG tablet Take 1 tablet (100 mg total) by mouth daily. 30 tablet 4   venlafaxine XR (EFFEXOR-XR) 150 MG 24 hr capsule Take 1 capsule by mouth daily with breakfast. 90 capsule 2   No current facility-administered medications for this visit.   Psychiatric Specialty Exam: Review of Systems  There were no vitals taken for this visit.There is no height or weight on file to calculate BMI.  General Appearance: Fairly Groomed  Eye Contact:  Fair  Speech:  Clear and Coherent and Normal Rate  Volume:  Normal  Mood:  Euthymic  Affect:  Appropriate  Thought Process:  Coherent  Orientation:  Full (Time, Place, and Person)  Thought Content: Logical and Illogical   Suicidal Thoughts:  No  Homicidal Thoughts:  No  Memory:  Immediate;   Fair  Judgement:  Fair  Insight:  Fair  Psychomotor Activity:  Normal  Concentration:  Concentration: Fair  Recall:  Fair  Fund of Knowledge: Fair  Language: Fair  Akathisia:  NA    AIMS (if indicated): not done  Assets:  Communication Skills Desire for Improvement Financial Resources/Insurance Talents/Skills  ADL's:  Intact  Cognition: WNL  Sleep:  Fair   Metabolic Disorder Labs: Lab Results  Component Value Date   HGBA1C 5.6 01/13/2024   MPG 114 01/13/2024   MPG 117 09/12/2023   No results found for: "PROLACTIN" Lab Results  Component Value Date   CHOL 153 01/13/2024   TRIG 279 (H) 01/13/2024   HDL 42 (L) 01/13/2024   CHOLHDL 3.6 01/13/2024   LDLCALC 74 01/13/2024   LDLCALC 60 09/12/2023   Lab Results  Component Value Date   TSH 5.15 (H) 01/13/2024   TSH 1.20 09/12/2023    Therapeutic Level Labs: No results found for: "LITHIUM" No results found for: "VALPROATE" No results found for: "CBMZ"   Screenings: GAD-7    Flowsheet Row [facility]  Total GAD-7 Score 14      PHQ2-9    Flowsheet Row Office Visit from 01/13/2024 in [facility] Office Visit from 12/13/2023 in [facility] Office Visit from 09/12/2023 in [facility] Office Visit from 05/02/2023 in [facility] Office Visit from [facility]10/2023 [facility]  PHQ-2 Total Score 0 0 0 0 3  PHQ-9 Total Score -- -- 0 -- 17      Flowsheet Row ED to Hosp-Admission (Discharged) from 05/08/2023 in [facility]URG UN[facility] Office Visit from 04[facility]10/2023 [facility] ED from 06/15/2021 in [facility] Emergency Department at [facility]  C-SSRS RISK CATEGORY No Risk Low Risk No Risk       Collaboration of Care: Collaboration of Care: Medication Management AEB medication prescription and Other provider involved in patient's care AEB internal med, neurology, and urology chart review  Patient/Guardian was advised Release of Information must be obtained prior to any record release in order to collaborate their care with an outside provider. Patient/Guardian was advised if they have not already done so to contact the  registration department to sign all necessary forms in order for [place] to release information regarding their care.   Consent: Patient/Guardian gives verbal consent for treatment and assignment of benefits for services provided during this visit. Patient/Guardian expressed understanding and agreed to proceed.    Virtual Visit via Video Note  I connected with Shannon Hart on 03/10/24 at  2:00 PM EDT by a video enabled telemedicine application and verified that I am speaking with the correct person using two identifiers.  Location: Patient: Home Provider: Home Office   I discussed the limitations of evaluation and  management by telemedicine and the availability of in person appointments. The patient expressed understanding and agreed to proceed.   I discussed the assessment and treatment plan with the patient. The patient was provided an opportunity to ask questions and all were answered. The patient agreed with the plan and demonstrated an understanding of the instructions.   The patient was advised to call back or seek an in-person evaluation if the symptoms worsen or if the condition fails to improve as anticipated.  I provided 15 minutes of non-face-to-face time during this encounter.   Stasia Cavalier, MD 03/10/2024, 2:23 PM

## 2024-03-10 ENCOUNTER — Ambulatory Visit (AMBULATORY_SURGERY_CENTER): Payer: Medicare Other

## 2024-03-10 ENCOUNTER — Encounter (HOSPITAL_COMMUNITY): Payer: Self-pay | Admitting: Psychiatry

## 2024-03-10 ENCOUNTER — Telehealth: Payer: Self-pay

## 2024-03-10 ENCOUNTER — Telehealth (HOSPITAL_BASED_OUTPATIENT_CLINIC_OR_DEPARTMENT_OTHER): Payer: Medicare Other | Admitting: Psychiatry

## 2024-03-10 ENCOUNTER — Other Ambulatory Visit

## 2024-03-10 VITALS — Ht 64.0 in | Wt 172.0 lb

## 2024-03-10 DIAGNOSIS — F5101 Primary insomnia: Secondary | ICD-10-CM | POA: Diagnosis not present

## 2024-03-10 DIAGNOSIS — Z860100 Personal history of colon polyps, unspecified: Secondary | ICD-10-CM

## 2024-03-10 DIAGNOSIS — F319 Bipolar disorder, unspecified: Secondary | ICD-10-CM | POA: Diagnosis not present

## 2024-03-10 MED ORDER — LAMOTRIGINE 200 MG PO TABS: 200.0000 mg | ORAL_TABLET | Freq: Every day | ORAL | 0 refills | Status: DC

## 2024-03-10 MED ORDER — NA SULFATE-K SULFATE-MG SULF 17.5-3.13-1.6 GM/177ML PO SOLN: 1.0000 | Freq: Once | ORAL | 0 refills | Status: AC

## 2024-03-10 MED ORDER — TRAZODONE HCL 50 MG PO TABS
50.0000 mg | ORAL_TABLET | Freq: Every day | ORAL | 0 refills | Status: DC
Start: 2024-03-10 — End: 2024-06-02

## 2024-03-10 NOTE — Telephone Encounter (Signed)
Reached patient and completed pre visit

## 2024-03-10 NOTE — Progress Notes (Signed)

## 2024-03-11 LAB — TSH: TSH: 4.56 m[IU]/L — ABNORMAL HIGH (ref 0.40–4.50)

## 2024-03-19 NOTE — Progress Notes (Signed)
 Tsh 4.56, improved from 5.15 - continue current medications

## 2024-03-26 MED ORDER — NA SULFATE-K SULFATE-MG SULF 17.5-3.13-1.6 GM/177ML PO SOLN: 1.0000 | Freq: Once | ORAL | 0 refills | Status: AC

## 2024-03-26 NOTE — Addendum Note (Signed)
 Addended by: Phillips Bread on: 03/26/2024 01:24 PM   Modules accepted: Orders

## 2024-03-26 NOTE — Telephone Encounter (Signed)
 Verified pharmacy to dsend prep to and informed pt that it should be available later today to pick up. Pt has no other questions at this time.

## 2024-03-26 NOTE — Telephone Encounter (Signed)
 Patient called stated she has not yet received the prep medication.

## 2024-03-27 ENCOUNTER — Ambulatory Visit
Admission: RE | Admit: 2024-03-27 | Discharge: 2024-03-27 | Disposition: A | Discharge status: Home / Self Care | Payer: Medicare Other | Source: Ambulatory Visit | Attending: Adult Health | Admitting: Adult Health

## 2024-03-27 DIAGNOSIS — Z122 Encounter for screening for malignant neoplasm of respiratory organs: Secondary | ICD-10-CM

## 2024-03-31 ENCOUNTER — Encounter: Payer: Self-pay | Admitting: Gastroenterology

## 2024-04-03 ENCOUNTER — Encounter: Payer: Medicare Other | Admitting: Gastroenterology

## 2024-04-03 ENCOUNTER — Telehealth: Payer: Self-pay | Admitting: Gastroenterology

## 2024-04-03 NOTE — Telephone Encounter (Signed)
 Pt woke up late and didn't start prep until 8:00 am.  She has finished drinking the prep and is still moving her bowels.  She says they are currently brown liquid.  I encouraged her to drink more water up until 9:30 am but to stop drinking all liquids at that point.  She will call back as needed.

## 2024-04-03 NOTE — Telephone Encounter (Signed)
 Inbound call from patient, states she just started her prep around 8:00 AM this morning, her procedure is at 10:30 AM. Patient would like for procedure to be pushed back at a later time due to her bowel movements still being "brown."

## 2024-04-13 ENCOUNTER — Ambulatory Visit: Payer: Medicare Other | Admitting: Adult Health

## 2024-04-20 ENCOUNTER — Encounter: Payer: Self-pay | Admitting: Adult Health

## 2024-04-24 ENCOUNTER — Ambulatory Visit: Payer: Self-pay | Admitting: Adult Health

## 2024-04-24 NOTE — Progress Notes (Signed)
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

## 2024-05-01 ENCOUNTER — Encounter: Payer: Self-pay | Admitting: Adult Health

## 2024-05-01 ENCOUNTER — Ambulatory Visit: Admitting: Adult Health

## 2024-05-01 VITALS — BP 140/88 | HR 63 | Temp 97.2°F | Ht 64.0 in | Wt 179.0 lb

## 2024-05-01 DIAGNOSIS — G4489 Other headache syndrome: Secondary | ICD-10-CM | POA: Diagnosis not present

## 2024-05-01 DIAGNOSIS — I10 Essential (primary) hypertension: Secondary | ICD-10-CM | POA: Diagnosis not present

## 2024-05-01 DIAGNOSIS — E890 Postprocedural hypothyroidism: Secondary | ICD-10-CM | POA: Diagnosis not present

## 2024-05-01 DIAGNOSIS — F319 Bipolar disorder, unspecified: Secondary | ICD-10-CM | POA: Diagnosis not present
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
## 2024-05-09 DIAGNOSIS — F319 Bipolar disorder, unspecified: Secondary | ICD-10-CM | POA: Insufficient documentation

## 2024-05-09 DIAGNOSIS — E890 Postprocedural hypothyroidism: Secondary | ICD-10-CM | POA: Insufficient documentation

## 2024-05-11 ENCOUNTER — Encounter: Payer: Self-pay | Admitting: Adult Health

## 2024-05-11 DIAGNOSIS — R21 Rash and other nonspecific skin eruption: Secondary | ICD-10-CM

## 2024-05-11 MED ORDER — TRIAMCINOLONE ACETONIDE 0.1 % EX CREA: TOPICAL_CREAM | Freq: Two times a day (BID) | CUTANEOUS | 5 refills | Status: AC | PRN

## 2024-05-12 ENCOUNTER — Encounter: Payer: Self-pay | Admitting: Gastroenterology

## 2024-05-12 ENCOUNTER — Ambulatory Visit (AMBULATORY_SURGERY_CENTER): Admitting: *Deleted

## 2024-05-12 VITALS — Ht 64.0 in | Wt 172.0 lb

## 2024-05-12 DIAGNOSIS — Z860100 Personal history of colon polyps, unspecified: Secondary | ICD-10-CM

## 2024-05-12 NOTE — Progress Notes (Signed)
 Pt's name and DOB verified at the beginning of the pre-visit wit 2 identifiers  Permission given to speak with daughter  Pt denies any difficulty with ambulating,sitting, laying down or rolling side to side  Pt has no issues moving head neck or swallowing  No egg or soy allergy known to patient   No issues known to pt with past sedation with any surgeries or procedures  No FH of Malignant Hyperthermia  Pt is not on home 02   Pt is not on blood thinners   Pt denies issues with constipation   Pt is not on dialysis  Pt denise any abnormal heart rhythms   Pt denies any upcoming cardiac testing  Patient's chart reviewed by Rogena Class CNRA prior to pre-visit and patient appropriate for the [facility].  Pre-visit completed and red dot placed by patient's name on their procedure day (on provider's schedule).     Visit by phone  Pt states weight is 172 lb    IInstructions reviewed. Pt given  both [facility] main # and MD on call # prior to instructions.  Pt states understanding of instructions. Instructed pt to review instructions again prior to procedure and call main # given if has questions.. Pt states they will.   Instructed pt on where to find instructions on My Chart.

## 2024-05-13 ENCOUNTER — Encounter: Payer: Self-pay | Admitting: Neurology

## 2024-05-18 ENCOUNTER — Other Ambulatory Visit (HOSPITAL_COMMUNITY): Payer: Self-pay | Admitting: Psychiatry

## 2024-05-18 DIAGNOSIS — F5101 Primary insomnia: Secondary | ICD-10-CM

## 2024-05-25 ENCOUNTER — Encounter: Payer: Self-pay | Admitting: Gastroenterology

## 2024-05-25 ENCOUNTER — Ambulatory Visit: Admitting: Gastroenterology

## 2024-05-25 VITALS — BP 140/47 | HR 60 | Temp 97.7°F | Resp 17 | Ht 64.0 in | Wt 172.0 lb

## 2024-05-25 DIAGNOSIS — K5730 Diverticulosis of large intestine without perforation or abscess without bleeding: Secondary | ICD-10-CM | POA: Diagnosis not present

## 2024-05-25 DIAGNOSIS — K644 Residual hemorrhoidal skin tags: Secondary | ICD-10-CM

## 2024-05-25 DIAGNOSIS — Z1211 Encounter for screening for malignant neoplasm of colon: Secondary | ICD-10-CM | POA: Diagnosis not present

## 2024-05-25 DIAGNOSIS — K648 Other hemorrhoids: Secondary | ICD-10-CM

## 2024-05-25 DIAGNOSIS — D125 Benign neoplasm of sigmoid colon: Secondary | ICD-10-CM

## 2024-05-25 DIAGNOSIS — Z860100 Personal history of colon polyps, unspecified: Secondary | ICD-10-CM

## 2024-05-25 MED ORDER — SODIUM CHLORIDE 0.9 % IV SOLN: 500.0000 mL | Freq: Once | INTRAVENOUS | Status: DC

## 2024-05-25 NOTE — Patient Instructions (Signed)
-  Handout on polyps, diverticulosis and hemorrhoids provided  YOU HAD AN ENDOSCOPIC PROCEDURE TODAY AT THE [facility]:   Refer to the procedure report that was given to you for any specific questions about what was found during the examination.  If the procedure report does not answer your questions, please call your gastroenterologist to clarify.  If you requested that your care partner not be given the details of your procedure findings, then the procedure report has been included in a sealed envelope for you to review at your convenience later.  YOU SHOULD EXPECT: Some feelings of bloating in the abdomen. Passage of more gas than usual.  Walking can help get rid of the air that was put into your GI tract during the procedure and reduce the bloating. If you had a lower endoscopy (such as a colonoscopy or flexible sigmoidoscopy) you may notice spotting of blood in your stool or on the toilet paper. If you underwent a bowel prep for your procedure, you may not have a normal bowel movement for a few days.  Please Note:  You might notice some irritation and congestion in your nose or some drainage.  This is from the oxygen used during your procedure.  There is no need for concern and it should clear up in a day or so.  SYMPTOMS TO REPORT IMMEDIATELY:  Following lower endoscopy (colonoscopy or flexible sigmoidoscopy):  Excessive amounts of blood in the stool  Significant tenderness or worsening of abdominal pains  Swelling of the abdomen that is new, acute  Fever of 100F or higher  For urgent or emergent issues, a gastroenterologist can be reached at any hour by calling [phone]. Do not use MyChart messaging for urgent concerns.    DIET:  We do recommend a small meal at first, but then you may proceed to your regular diet.  Drink plenty of fluids but you should avoid alcoholic beverages for 24 hours.  ACTIVITY:  You should plan to take it easy for the rest of today and you  should NOT DRIVE or use heavy machinery until tomorrow (because of the sedation medicines used during the test).    FOLLOW UP: Our staff will call the number listed on your records the next business day following your procedure.  We will call around 7:15- 8:00 am to check on you and address any questions or concerns that you may have regarding the information given to you following your procedure. If we do not reach you, we will leave a message.     If any biopsies were taken you will be contacted by phone or by letter within the next 1-3 weeks.  Please call us  at [phone] if you have not heard about the biopsies in 3 weeks.    SIGNATURES/CONFIDENTIALITY: You and/or your care partner have signed paperwork which will be entered into your electronic medical record.  These signatures attest to the fact that that the information above on your After Visit Summary has been reviewed and is understood.  Full responsibility of the confidentiality of this discharge information lies with you and/or your care-partner.

## 2024-05-25 NOTE — Progress Notes (Unsigned)
VS completed by DT.  Pt's states no medical or surgical changes since previsit or office visit.  

## 2024-05-25 NOTE — Progress Notes (Unsigned)
 Vss nad trans to pacu

## 2024-05-25 NOTE — Progress Notes (Signed)
 Called to room to assist during endoscopic procedure.  Patient ID and intended procedure confirmed with present staff. Received instructions for my participation in the procedure from the performing physician.

## 2024-05-25 NOTE — Progress Notes (Unsigned)
 [facility] History and Physical   Primary Care Physician:  Medina-Vargas, Monina C, NP   Reason for Procedure:  History of adenomatous colon polyps  Plan:    Surveillance colonoscopy with possible interventions as needed     HPI: Shannon Hart is a very pleasant 73 y.o. female here for surveillance colonoscopy. Denies any nausea, vomiting, abdominal pain, melena or bright red blood per rectum  The risks and benefits as well as alternatives of endoscopic procedure(s) have been discussed and reviewed. All questions answered. The patient agrees to proceed.    Past Medical History:  Diagnosis Date   Allergy    Alzheimer's dementia (HCC)    Anxiety    Arthritis    Bipolar 1 disorder (HCC)    Cancer (HCC)    Cataract    CSF leak    Depression    Hyperlipidemia    Hypertension    Neuromuscular disorder (HCC)    Osteoporosis    Sleep apnea    TBI (traumatic brain injury) (HCC)    Thyroid disease    TIA (transient ischemic attack)    Vitamin B6 deficiency    Vitamin D  deficiency     Past Surgical History:  Procedure Laterality Date   ABDOMINAL HYSTERECTOMY     bladder surgery     Bladder pacemaker   COLONOSCOPY     DG SHOULDER RIGHT COMPLETE  2023   RECTOVAGINAL FISTULA CLOSURE     ROTATOR CUFF REPAIR     THYROIDECTOMY  2004    Prior to Admission medications   Medication Sig Start Date End Date Taking? Authorizing Provider  acetaminophen  (TYLENOL ) 650 MG CR tablet Take 1,300 mg by mouth every 8 (eight) hours as needed for pain.   Yes [provider]  alendronate  (FOSAMAX ) 70 MG tablet Take 1 tablet (70 mg total) by mouth every 7 (seven) days. Take with a full glass of water on an empty stomach. 11/19/23  Yes Medina-Vargas, Monina C, NP  ascorbic acid (VITAMIN C) 500 MG tablet Take 500 mg by mouth 2 (two) times daily.   Yes [provider]  atorvastatin  (LIPITOR) 40 MG tablet Take 1 tablet by mouth daily. 12/20/23  Yes Medina-Vargas,  Monina C, NP  donepezil  (ARICEPT ) 10 MG tablet Take 1 tablet (10 mg total) by mouth at bedtime. 01/08/24  Yes Phebe Brasil, MD  lamoTRIgine  (LAMICTAL ) 200 MG tablet Take 1 tablet (200 mg total) by mouth daily. 03/10/24  Yes Yves Herb, MD  levothyroxine  (SYNTHROID ) 88 MCG tablet Take 1 tablet (88 mcg total) by mouth daily. 01/19/24  Yes Medina-Vargas, Monina C, NP  lisinopril  (ZESTRIL ) 40 MG tablet Take 1 tablet (40 mg total) by mouth daily. 12/09/23  Yes Medina-Vargas, Monina C, NP  loratadine (CLARITIN) 10 MG tablet Take 10 mg by mouth daily.   Yes [provider]  Omega-3 Fatty Acids (FISH OIL) 1000 MG CAPS Take 1 capsule by mouth daily.   Yes [provider]  oxybutynin  (DITROPAN -XL) 10 MG 24 hr tablet Take 1 tablet (10 mg total) by mouth in the morning and at bedtime. 11/11/23  Yes MacDiarmid, Geralyn Knee, MD  pregabalin  (LYRICA ) 50 MG capsule Take 1 capsule by mouth twice daily. 02/18/24  Yes Medina-Vargas, Monina C, NP  Rimegepant Sulfate (NURTEC) 75 MG TBDP Place 1 tablet under the tongue every other day.   Yes [provider]  traZODone  (DESYREL ) 50 MG tablet Take 1 tablet (50 mg total) by mouth at bedtime. 03/10/24  Yes Donnelly Gainer  M, MD  trimethoprim  (TRIMPEX ) 100 MG tablet Take 1 tablet (100 mg total) by mouth daily. 02/04/24  Yes MacDiarmid, Geralyn Knee, MD  venlafaxine  XR (EFFEXOR -XR) 150 MG 24 hr capsule Take 1 capsule by mouth daily with breakfast. 01/20/24  Yes Medina-Vargas, Monina C, NP  albuterol  (VENTOLIN  HFA) 108 (90 Base) MCG/ACT inhaler Inhale 2 puffs into the lungs every 6 (six) hours as needed for wheezing or shortness of breath. Patient not taking: Reported on 03/10/2024 05/09/23   Leona Rake, MD  butalbital -acetaminophen -caffeine  (FIORICET ) 50-325-40 MG tablet Take 2 tablets by mouth as needed for headache. 05/13/23   Medina-Vargas, Monina C, NP  ferrous sulfate  325 (65 FE) MG tablet Take 1 tablet (325 mg total) by mouth daily. 12/09/23   Medina-Vargas, Monina C,  NP  ketoconazole  (NIZORAL ) 2 % shampoo Three times per week, massage into scalp and leave in for 8-10 minutes before rinsing out. 07/31/23   Harris Liming, MD  triamcinolone  cream (KENALOG ) 0.1 % Apply topically 2 (two) times daily as needed. 05/11/24   Medina-Vargas, Monina C, NP    Current Outpatient Medications  Medication Sig Dispense Refill   acetaminophen  (TYLENOL ) 650 MG CR tablet Take 1,300 mg by mouth every 8 (eight) hours as needed for pain.     alendronate  (FOSAMAX ) 70 MG tablet Take 1 tablet (70 mg total) by mouth every 7 (seven) days. Take with a full glass of water on an empty stomach. 4 tablet 11   ascorbic acid (VITAMIN C) 500 MG tablet Take 500 mg by mouth 2 (two) times daily.     atorvastatin  (LIPITOR) 40 MG tablet Take 1 tablet by mouth daily. 90 tablet 1   donepezil  (ARICEPT ) 10 MG tablet Take 1 tablet (10 mg total) by mouth at bedtime. 90 tablet 2   lamoTRIgine  (LAMICTAL ) 200 MG tablet Take 1 tablet (200 mg total) by mouth daily. 90 tablet 0   levothyroxine  (SYNTHROID ) 88 MCG tablet Take 1 tablet (88 mcg total) by mouth daily. 90 tablet 3   lisinopril  (ZESTRIL ) 40 MG tablet Take 1 tablet (40 mg total) by mouth daily. 90 tablet 1   loratadine (CLARITIN) 10 MG tablet Take 10 mg by mouth daily.     Omega-3 Fatty Acids (FISH OIL) 1000 MG CAPS Take 1 capsule by mouth daily.     oxybutynin  (DITROPAN -XL) 10 MG 24 hr tablet Take 1 tablet (10 mg total) by mouth in the morning and at bedtime. 60 tablet 11   pregabalin  (LYRICA ) 50 MG capsule Take 1 capsule by mouth twice daily. 60 capsule 4   Rimegepant Sulfate (NURTEC) 75 MG TBDP Place 1 tablet under the tongue every other day.     traZODone  (DESYREL ) 50 MG tablet Take 1 tablet (50 mg total) by mouth at bedtime. 90 tablet 0   trimethoprim  (TRIMPEX ) 100 MG tablet Take 1 tablet (100 mg total) by mouth daily. 30 tablet 4   venlafaxine  XR (EFFEXOR -XR) 150 MG 24 hr capsule Take 1 capsule by mouth daily with breakfast. 90 capsule 2    albuterol  (VENTOLIN  HFA) 108 (90 Base) MCG/ACT inhaler Inhale 2 puffs into the lungs every 6 (six) hours as needed for wheezing or shortness of breath. (Patient not taking: Reported on 03/10/2024) 8 g 0   butalbital -acetaminophen -caffeine  (FIORICET ) 50-325-40 MG tablet Take 2 tablets by mouth as needed for headache. 20 tablet 0   ferrous sulfate  325 (65 FE) MG tablet Take 1 tablet (325 mg total) by mouth daily. 90 tablet 1   ketoconazole  (NIZORAL ) 2 %  shampoo Three times per week, massage into scalp and leave in for 8-10 minutes before rinsing out. 120 mL 11   triamcinolone  cream (KENALOG ) 0.1 % Apply topically 2 (two) times daily as needed. 30 g 5   Current Facility-Administered Medications  Medication Dose Route Frequency Provider Last Rate Last Admin   0.9 %  sodium chloride  infusion  500 mL Intravenous Once Lorraina Spring V, MD        Allergies as of 05/25/2024 - Review Complete 05/25/2024  Allergen Reaction Noted   Latex Rash 06/15/2021   Other Rash 02/09/2014   Wound dressing adhesive Rash 02/19/2014    Family History  Problem Relation Age of Onset   Uterine cancer Mother    Colon cancer Neg Hx    Colon polyps Neg Hx    Esophageal cancer Neg Hx    Rectal cancer Neg Hx    Stomach cancer Neg Hx     Social History   Socioeconomic History   Marital status: Divorced    Spouse name: Not on file   Number of children: Not on file   Years of education: Not on file   Highest education level: Some college, no degree  Occupational History   Not on file  Tobacco Use   Smoking status: Every Day    Current packs/day: 0.50    Average packs/day: 0.5 packs/day for 51.0 years (25.5 ttl pk-yrs)    Types: Cigarettes    Passive exposure: Current   Smokeless tobacco: Never  Vaping Use   Vaping status: Never Used  Substance and Sexual Activity   Alcohol use: Not Currently   Drug use: Never   Sexual activity: Not Currently  Other Topics Concern   Not on file  Social History  Narrative   Not on file   Social Drivers of Health   Financial Resource Strain: Low Risk  (09/12/2023)   Overall Financial Resource Strain (CARDIA)    Difficulty of Paying Living Expenses: Not very hard  Food Insecurity: Food Insecurity Present (09/12/2023)   Hunger Vital Sign    Worried About Running Out of Food in the Last Year: Sometimes true    Ran Out of Food in the Last Year: Sometimes true  Transportation Needs: Unmet Transportation Needs (09/12/2023)   PRAPARE - Administrator, Civil Service (Medical): Yes    Lack of Transportation (Non-Medical): No  Physical Activity: Unknown (09/12/2023)   Exercise Vital Sign    Days of Exercise per Week: Patient declined    Minutes of Exercise per Session: Not on file  Stress: Patient Declined (09/12/2023)   [employer] of Occupational Health - Occupational Stress Questionnaire    Feeling of Stress : Patient declined  Social Connections: Socially Isolated (09/12/2023)   Social Connection and Isolation Panel    Frequency of Communication with Friends and Family: Three times a week    Frequency of Social Gatherings with Friends and Family: Once a week    Attends Religious Services: Never    Database administrator or Organizations: No    Attends Engineer, structural: Not on file    Marital Status: Divorced  Intimate Partner Violence: Patient Unable To Answer (05/08/2023)   Humiliation, Afraid, Rape, and Kick questionnaire    Fear of Current or Ex-Partner: Patient unable to answer    Emotionally Abused: Patient unable to answer    Physically Abused: Patient unable to answer    Sexually Abused: Patient unable to answer    Review of  Systems:  All other review of systems negative except as mentioned in the HPI.  Physical Exam: Vital signs in last 24 hours: BP (!) 157/81   Pulse 67   Temp 97.7 F (36.5 C) (Temporal)   Ht 5' 4 (1.626 m)   Wt 172 lb (78 kg)   SpO2 95%   BMI 29.52 kg/m  General:   Alert,  NAD Lungs:  Clear .   Heart:  Regular rate and rhythm Abdomen:  Soft, nontender and nondistended. Neuro/Psych:  Alert and cooperative. Normal mood and affect. A and O x 3  Reviewed labs, radiology imaging, old records and pertinent past GI work up  Patient is appropriate for planned procedure(s) and anesthesia in an ambulatory setting   K. Veena Khiana Camino , MD [phone]

## 2024-05-25 NOTE — Op Note (Signed)
 [facility] Patient Name: Shannon Hart Procedure Date: 05/25/2024 3:47 PM MRN: [id number] Endoscopist: Sergio Dandy , MD, [id number] Age: 73 Referring MD:  Date of Birth: 1951/01/19 Gender: Female Account #: [id number] Procedure:                Colonoscopy Indications:              High risk colon cancer surveillance: Personal                            history of colonic polyps, High risk colon cancer                            surveillance: Personal history of multiple (3 or                            more) adenomas. Inadequate bowel prep on last                            colonoscopy Medicines:                Monitored Anesthesia Care Procedure:                Pre-Anesthesia Assessment:                           - Prior to the procedure, a History and Physical                            was performed, and patient medications and                            allergies were reviewed. The patient's tolerance of                            previous anesthesia was also reviewed. The risks                            and benefits of the procedure and the sedation                            options and risks were discussed with the patient.                            All questions were answered, and informed consent                            was obtained. Prior Anticoagulants: The patient has                            taken no anticoagulant or antiplatelet agents. ASA                            Grade Assessment: III - A patient with severe  systemic disease. After reviewing the risks and                            benefits, the patient was deemed in satisfactory                            condition to undergo the procedure.                           After obtaining informed consent, the colonoscope                            was passed under direct vision. Throughout the                            procedure, the patient's blood pressure, pulse, and                             oxygen saturations were monitored continuously. The                            Olympus Scope SN: [id number] was introduced through                            the anus and advanced to the the cecum, identified                            by appendiceal orifice and ileocecal valve. The                            colonoscopy was performed without difficulty. The                            patient tolerated the procedure well. The quality                            of the bowel preparation was adequate. The                            ileocecal valve, appendiceal orifice, and rectum                            were photographed. Scope In: 3:55:51 PM Scope Out: 4:16:06 PM Scope Withdrawal Time: 0 hours 9 minutes 23 seconds  Total Procedure Duration: 0 hours 20 minutes 15 seconds  Findings:                 The perianal and digital rectal examinations were                            normal.                           A 5 mm polyp was found in the sigmoid colon. The  polyp was sessile. The polyp was removed with a                            cold snare. Resection and retrieval were complete.                           Scattered small-mouthed diverticula were found in                            the sigmoid colon and descending colon. There was                            narrowing of the colon in association with the                            diverticular opening. There was evidence of                            diverticular spasm.                           Non-bleeding external and internal hemorrhoids were                            found during retroflexion. The hemorrhoids were                            small. Complications:            No immediate complications. Estimated Blood Loss:     Estimated blood loss was minimal. Impression:               - One 5 mm polyp in the sigmoid colon, removed with                            a cold snare. Resected  and retrieved.                           - Severe diverticulosis in the sigmoid colon and in                            the descending colon. There was narrowing of the                            colon in association with the diverticular opening.                            There was evidence of diverticular spasm.                           - Non-bleeding external and internal hemorrhoids. Recommendation:           - Resume previous diet.                           - Continue present  medications.                           - Await pathology results.                           - No repeat colonoscopy due to age. Aishani Kalis V. Taniqua Issa, MD 05/25/2024 4:20:24 PM This report has been signed electronically.

## 2024-05-26 ENCOUNTER — Telehealth: Payer: Self-pay | Admitting: *Deleted

## 2024-05-26 NOTE — Telephone Encounter (Signed)
  Follow up Call-     05/25/2024    2:49 PM 02/18/2023    2:05 PM  Call back number  Post procedure Call Back phone  # [phone]  Permission to leave phone message Yes Yes    Post procedure follow up phone call. No answer at number given.  Unable to LM.  VM box full.

## 2024-05-29 LAB — SURGICAL PATHOLOGY

## 2024-06-02 ENCOUNTER — Other Ambulatory Visit: Payer: Self-pay

## 2024-06-02 ENCOUNTER — Ambulatory Visit (HOSPITAL_COMMUNITY): Admitting: Psychiatry

## 2024-06-02 ENCOUNTER — Encounter (HOSPITAL_COMMUNITY): Payer: Self-pay | Admitting: Psychiatry

## 2024-06-02 DIAGNOSIS — F319 Bipolar disorder, unspecified: Secondary | ICD-10-CM

## 2024-06-02 DIAGNOSIS — F5101 Primary insomnia: Secondary | ICD-10-CM

## 2024-06-02 MED ORDER — TRAZODONE HCL 50 MG PO TABS: 50.0000 mg | ORAL_TABLET | Freq: Every day | ORAL | 0 refills | Status: DC

## 2024-06-02 MED ORDER — LAMOTRIGINE 200 MG PO TABS: 200.0000 mg | ORAL_TABLET | Freq: Every day | ORAL | 0 refills | Status: DC
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
## 2024-06-03 ENCOUNTER — Encounter (HOSPITAL_COMMUNITY): Payer: Self-pay | Admitting: Psychiatry

## 2024-06-17 ENCOUNTER — Other Ambulatory Visit: Payer: Self-pay | Admitting: Adult Health

## 2024-06-18 ENCOUNTER — Encounter: Payer: Self-pay | Admitting: Adult Health

## 2024-06-18 ENCOUNTER — Ambulatory Visit (INDEPENDENT_AMBULATORY_CARE_PROVIDER_SITE_OTHER): Admitting: Adult Health

## 2024-06-18 VITALS — BP 110/70 | Temp 97.5°F | Resp 11 | Ht 61.0 in | Wt 176.8 lb

## 2024-06-18 DIAGNOSIS — F3170 Bipolar disorder, currently in remission, most recent episode unspecified: Secondary | ICD-10-CM

## 2024-06-18 DIAGNOSIS — E781 Pure hyperglyceridemia: Secondary | ICD-10-CM | POA: Diagnosis not present

## 2024-06-18 DIAGNOSIS — I10 Essential (primary) hypertension: Secondary | ICD-10-CM

## 2024-06-18 DIAGNOSIS — R569 Unspecified convulsions: Secondary | ICD-10-CM

## 2024-06-18 DIAGNOSIS — F5101 Primary insomnia: Secondary | ICD-10-CM

## 2024-06-18 DIAGNOSIS — R419 Unspecified symptoms and signs involving cognitive functions and awareness: Secondary | ICD-10-CM

## 2024-06-18 DIAGNOSIS — M810 Age-related osteoporosis without current pathological fracture: Secondary | ICD-10-CM

## 2024-06-18 DIAGNOSIS — M797 Fibromyalgia: Secondary | ICD-10-CM

## 2024-06-18 DIAGNOSIS — R7303 Prediabetes: Secondary | ICD-10-CM

## 2024-06-18 DIAGNOSIS — E890 Postprocedural hypothyroidism: Secondary | ICD-10-CM

## 2024-06-18 DIAGNOSIS — R4189 Other symptoms and signs involving cognitive functions and awareness: Secondary | ICD-10-CM

## 2024-06-18 LAB — TSH: TSH: 0.49 m[IU]/L (ref 0.40–4.50)

## 2024-06-18 MED ORDER — PREGABALIN 75 MG PO CAPS: 75.0000 mg | ORAL_CAPSULE | Freq: Two times a day (BID) | ORAL | 1 refills | Status: DC
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
## 2024-06-22 ENCOUNTER — Ambulatory Visit: Payer: Self-pay | Admitting: Adult Health

## 2024-06-22 NOTE — Progress Notes (Signed)
 TSH normal

## 2024-06-29 ENCOUNTER — Ambulatory Visit: Payer: Medicare Other | Admitting: Adult Health

## 2024-07-29 ENCOUNTER — Ambulatory Visit: Payer: Self-pay | Admitting: Gastroenterology

## 2024-07-30 ENCOUNTER — Encounter: Payer: Medicare Other | Admitting: Dermatology

## 2024-08-13 ENCOUNTER — Ambulatory Visit: Admitting: Dermatology

## 2024-08-13 ENCOUNTER — Encounter: Payer: Self-pay | Admitting: Dermatology

## 2024-08-13 DIAGNOSIS — L814 Other melanin hyperpigmentation: Secondary | ICD-10-CM

## 2024-08-13 DIAGNOSIS — W908XXA Exposure to other nonionizing radiation, initial encounter: Secondary | ICD-10-CM | POA: Diagnosis not present

## 2024-08-13 DIAGNOSIS — B351 Tinea unguium: Secondary | ICD-10-CM

## 2024-08-13 DIAGNOSIS — D239 Other benign neoplasm of skin, unspecified: Secondary | ICD-10-CM

## 2024-08-13 DIAGNOSIS — Z1283 Encounter for screening for malignant neoplasm of skin: Secondary | ICD-10-CM

## 2024-08-13 DIAGNOSIS — L821 Other seborrheic keratosis: Secondary | ICD-10-CM

## 2024-08-13 DIAGNOSIS — L578 Other skin changes due to chronic exposure to nonionizing radiation: Secondary | ICD-10-CM | POA: Diagnosis not present

## 2024-08-13 DIAGNOSIS — L570 Actinic keratosis: Secondary | ICD-10-CM

## 2024-08-13 DIAGNOSIS — L219 Seborrheic dermatitis, unspecified: Secondary | ICD-10-CM

## 2024-08-13 DIAGNOSIS — Z872 Personal history of diseases of the skin and subcutaneous tissue: Secondary | ICD-10-CM

## 2024-08-13 DIAGNOSIS — D229 Melanocytic nevi, unspecified: Secondary | ICD-10-CM

## 2024-08-13 DIAGNOSIS — D2372 Other benign neoplasm of skin of left lower limb, including hip: Secondary | ICD-10-CM

## 2024-08-13 DIAGNOSIS — D1801 Hemangioma of skin and subcutaneous tissue: Secondary | ICD-10-CM

## 2024-08-13 MED ORDER — KETOCONAZOLE 2 % EX SHAM: MEDICATED_SHAMPOO | CUTANEOUS | 11 refills | Status: AC

## 2024-08-13 NOTE — Progress Notes (Signed)
 Follow-Up Visit   Subjective  Shannon Hart is a 73 y.o. female who presents for the following: Skin Cancer Screening and Full Body Skin Exam  The patient presents for Total-Body Skin Exam (TBSE) for skin cancer screening and mole check. The patient has spots, moles and lesions to be evaluated, some may be new or changing and the patient may have concern these could be cancer.  Patient advises she does have hx of skin cancer at face but could only find bx proven AK at nose, all other pathology was benign. She does use ketoconazole  shampoo for seb derm>psoriasis at scalp.   The following portions of the chart were reviewed this encounter and updated as appropriate: medications, allergies, medical history  Review of Systems:  No other skin or systemic complaints except as noted in HPI or Assessment and Plan.  Objective  Well appearing patient in no apparent distress; mood and affect are within normal limits.  A full examination was performed including scalp, head, eyes, ears, nose, lips, neck, chest, axillae, abdomen, back, buttocks, bilateral upper extremities, bilateral lower extremities, hands, feet, fingers, toes, fingernails, and toenails. All findings within normal limits unless otherwise noted below.   Exam of nails limited by presence of nail polish.  Relevant physical exam findings are noted in the Assessment and Plan.  right cheek Erythematous thin papules/macules with gritty scale.   Assessment & Plan   SKIN CANCER SCREENING PERFORMED TODAY.  ACTINIC DAMAGE - Chronic condition, secondary to cumulative UV/sun exposure - diffuse scaly erythematous macules with underlying dyspigmentation - Recommend daily broad spectrum sunscreen SPF 30+ to sun-exposed areas, reapply every 2 hours as needed.  - Staying in the shade or wearing long sleeves, sun glasses (UVA+UVB protection) and wide brim hats (4-inch brim around the entire circumference of the hat) are also recommended for sun  protection.  - Call for new or changing lesions.  LENTIGINES, SEBORRHEIC KERATOSES, HEMANGIOMAS - Benign normal skin lesions - Benign-appearing - Call for any changes  MELANOCYTIC NEVI - Tan-brown and/or pink-flesh-colored symmetric macules and papules - Benign appearing on exam today - Observation - Call clinic for new or changing moles - Recommend daily use of broad spectrum spf 30+ sunscreen to sun-exposed areas.   HISTORY OF PRECANCEROUS ACTINIC KERATOSIS - bx proven at nasal bridge 07/10/2018 [facility] - site(s) of PreCancerous Actinic Keratosis clear today. - these may recur and new lesions may form requiring treatment to prevent transformation into skin cancer - observe for new or changing spots and contact [facility] for appointment if occur - photoprotection with sun protective clothing; sunglasses and broad spectrum sunscreen with SPF of at least 30 + and frequent self skin exams recommended - yearly exams by a dermatologist recommended for persons with history of PreCancerous Actinic Keratoses  DERMATOFIBROMA Exam: Firm pink/brown papulenodule with dimple sign at left thigh. Treatment Plan: A dermatofibroma is a benign growth possibly related to trauma, such as an insect bite, cut from shaving, or inflamed acne-type bump.  Treatment options to remove include shave or excision with resulting scar and risk of recurrence.  Since benign-appearing and not bothersome, will observe for now.   ONYCHOMYCOSIS Exam: Thickened toenails with subungal debris c/w onychomycosis  Chronic and persistent condition with duration or expected duration over one year. Condition is symptomatic/ bothersome to patient. Not currently at goal.  Treatment Plan: Not bothersome for patient.  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 Condition has improved with treatment, currently at goal.    Seborrheic  Dermatitis is a chronic persistent rash characterized by pinkness and scaling most commonly of the mid face but also can occur on the scalp (dandruff), ears; mid chest, mid back and groin.  It tends to be exacerbated by stress and cooler weather.  People who have neurologic disease may experience new onset or exacerbation of existing seborrheic dermatitis.  The condition is not curable but treatable and can be controlled.   Treatment Plan: Continue ketoconazole  shampoo  three times per week, massage into scalp and leave in for 8-10 minutes before rinsing out. This can be followed by conditioner or shampoo and conditioner of your choice AK (ACTINIC KERATOSIS) right cheek Actinic keratoses are precancerous spots that appear secondary to cumulative UV radiation exposure/sun exposure over time. They are chronic with expected duration over 1 year. A portion of actinic keratoses will progress to squamous cell carcinoma of the skin. It is not possible to reliably predict which spots will progress to skin cancer and so treatment is recommended to prevent development of skin cancer.  Recommend daily broad spectrum sunscreen SPF 30+ to sun-exposed areas, reapply every 2 hours as needed.  Recommend staying in the shade or wearing long sleeves, sun glasses (UVA+UVB protection) and wide brim hats (4-inch brim around the entire circumference of the hat). Call for new or changing lesions. Destruction of lesion - right cheek Complexity: simple   Destruction method: cryotherapy   Informed consent: discussed and consent obtained   Timeout:  patient name, date of birth, surgical site, and procedure verified Lesion destroyed using liquid nitrogen: Yes   Region frozen until ice ball extended beyond lesion: Yes   Cryo cycles: 1 or 2. Outcome: patient tolerated procedure well with no complications   Post-procedure details: wound care instructions given    SEBORRHEIC DERMATITIS   Related Medications ketoconazole   (NIZORAL ) 2 % shampoo Three times per week, massage into scalp and leave in for 8-10 minutes before rinsing out. MULTIPLE BENIGN NEVI   LENTIGINES   ACTINIC ELASTOSIS   SEBORRHEIC KERATOSES   CHERRY ANGIOMA   ONYCHOMYCOSIS   DERMATOFIBROMA   Return in about 1 year (around 08/13/2025) for TBSE, with Dr. Claudene, HxAK.  LILLETTE Lonell Drones, RMA, am acting as scribe for Boneta Claudene, MD .   Documentation: I have reviewed the above documentation for accuracy and completeness, and I agree with the above.  Boneta Claudene, MD

## 2024-08-13 NOTE — Patient Instructions (Signed)

## 2024-08-31 NOTE — Progress Notes (Unsigned)
 BH MD/PA/NP OP Progress Note  09/01/2024 4:01 PM Shannon Hart  MRN:  [id number]  Visit Diagnosis:    ICD-10-CM   1. Bipolar 1 disorder (HCC)  F31.9 venlafaxine  XR (EFFEXOR -XR) 150 MG 24 hr capsule    lamoTRIgine  (LAMICTAL ) 200 MG tablet    2. Primary insomnia  F51.01 traZODone  (DESYREL ) 50 MG tablet      Assessment: Shannon Hart is a 73 y.o. female with a history of MDD, GAD, OCPD, bipolar disorder, Alzheimer's, TBI, OSA, and vitamin D  deficiency who presented to [facility] at [facility] for initial evaluation on 04/10/2023.    At initial evaluation patient reported a history of bipolar disorder with manic episodes where she has decreased sleep, racing thoughts, flight of ideas, reckless behaviors, and pressured speech which can last 3-4 days. She denies any delusions, hallucinations, paranoia, or grandiosity.  During the depressive phases patient has low mood, decreased energy, amotivation, anhedonia, and at times passive SI.  She denies any SI since 2020 when she was hospitalized.  At initial evaluation patient was having mild fluctuations between high and low phases where patient was more energetic during the up phases and spent more time in bed during the down phases.  In addition to the diagnosis of bipolar disorder patient also has past history of a TBI following oxygen 2006 and has had signs of memory loss over the past several years.  There has been concern for Alzheimer's and frontotemporal dementia based off of imaging.  Most recent MoCA was a 23 out of 30 on 01/17/23.  Shannon Hart presents for follow-up evaluation. Today, 09/01/24, patient reports  stable mood in the interim.  She has been compliant with her medications denying any adverse side effects.  Memory and recall still remain concerns and patient had canceled scheduled neurology appointment.  We will rerefer today and encouraged patient to keep any scheduled appointments. Given stability would be appropriate to  continue on current regimen.  We will follow up in 3 months.  Plan: - Continue Lamictal  200 mg QD - Continue Venlafaxine  XR 150 mg QD - Continue Trazodone  50 mg QHS - Continue donepezil  10 mg at bedtime managed by neurology - Continue pregabalin  50 mg BID managed by PCP - CMP, CBC, B12, and TSH reviewed - Head CT w/o contrast from 07/12/22 reviewed and showed moderate cerebral atrophy, concentrated at the frontal, and temporal greater than posterior parietal lobes. 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  -Neurology referral placed - Crisis resources reviewed - Follow up in 3 months  Chief Complaint:  Chief Complaint  Patient presents with   Follow-up   HPI: Patient presents alongside her daughter who participated in the session with patient's permission.  She reports that things have been fairly good over the past few months.  She can get down on occasion but overall feels like it is not affecting her as significantly it used to.  Sleeps been relatively good with patient getting around 7 hours at night in addition to 30 minutes of naps.  That said she does have difficulty with sleep initiation and might lay in bed for couple hours before falling asleep.  She is taking her medications consistently and denies any adverse side effects.  Per her daughter there has been no new concerns.  Memory issues has been an issue and they still need to connect with a neurologist.   Past Psychiatric History: Patient had been connected with a psychiatric provider at [facility] in the past over 20  years ago.  She also reports having a therapist in the past but none currently.  Patient reports that she has been hospitalized in December of 2020 for suicidal ideation.  She reports a suicide attempt at the end of 2020 and the beginning of 2021.  Patient has tried Mirtazapine - not helpful for anxiety/depression, Lexapro, Celexa, Depakote - ineffective, Wellbutrin- ineffective, Abilify - akathesia, Seroquel -  ineffective, Geodon, Prozac - ineffective, Cymbalta - caused hallucinations, Provigil, and gabapentin in the past.  Zyprexa had been discussed, but patient and family do not want this 2/2 risk of weight gain and side effects.   Lamictal  has been extremely effective for her daughter.   Patient denies any substance use other than nicotine .  She reports smoking half a pack a day since she was 22.  Past Medical History:  Past Medical History:  Diagnosis Date   Allergy    Alzheimer's dementia (HCC)    Anxiety    Arthritis    Bipolar 1 disorder (HCC)    Cancer (HCC)    Cataract    CSF leak    Depression    Hyperlipidemia    Hypertension    Neuromuscular disorder (HCC)    Osteoporosis    Sleep apnea    TBI (traumatic brain injury) (HCC)    Thyroid disease    TIA (transient ischemic attack)    Vitamin B6 deficiency    Vitamin D  deficiency     Past Surgical History:  Procedure Laterality Date   ABDOMINAL HYSTERECTOMY     bladder surgery     Bladder pacemaker   COLONOSCOPY     DG SHOULDER RIGHT COMPLETE  2023   RECTOVAGINAL FISTULA CLOSURE     ROTATOR CUFF REPAIR     THYROIDECTOMY  2004   Family History:  Family History  Problem Relation Age of Onset   Uterine cancer Mother    Colon cancer Neg Hx    Colon polyps Neg Hx    Esophageal cancer Neg Hx    Rectal cancer Neg Hx    Stomach cancer Neg Hx     Social History:  Social History   Socioeconomic History   Marital status: Divorced    Spouse name: Not on file   Number of children: Not on file   Years of education: Not on file   Highest education level: Some college, no degree  Occupational History   Not on file  Tobacco Use   Smoking status: Every Day    Current packs/day: 0.50    Average packs/day: 0.5 packs/day for 51.0 years (25.5 ttl pk-yrs)    Types: Cigarettes    Passive exposure: Current   Smokeless tobacco: Never  Vaping Use   Vaping status: Never Used  Substance and Sexual Activity   Alcohol use:  Not Currently   Drug use: Never   Sexual activity: Not Currently  Other Topics Concern   Not on file  Social History Narrative   Not on file   Social Drivers of Health   Financial Resource Strain: Low Risk  (09/12/2023)   Overall Financial Resource Strain (CARDIA)    Difficulty of Paying Living Expenses: Not very hard  Food Insecurity: Food Insecurity Present (09/12/2023)   Hunger Vital Sign    Worried About Running Out of Food in the Last Year: Sometimes true    Ran Out of Food in the Last Year: Sometimes true  Transportation Needs: Unmet Transportation Needs (09/12/2023)   PRAPARE - Transportation    Lack  of Transportation (Medical): Yes    Lack of Transportation (Non-Medical): No  Physical Activity: Unknown (09/12/2023)   Exercise Vital Sign    Days of Exercise per Week: Patient declined    Minutes of Exercise per Session: Not on file  Stress: Patient Declined (09/12/2023)   [employer] of Occupational Health - Occupational Stress Questionnaire    Feeling of Stress : Patient declined  Social Connections: Socially Isolated (09/12/2023)   Social Connection and Isolation Panel    Frequency of Communication with Friends and Family: Three times a week    Frequency of Social Gatherings with Friends and Family: Once a week    Attends Religious Services: Never    Database administrator or Organizations: No    Attends Engineer, structural: Not on file    Marital Status: Divorced    Allergies:  Allergies  Allergen Reactions   Latex Rash   Other Rash    Tape made her have blisters   Wound Dressing Adhesive Rash    Current Medications: Current Outpatient Medications  Medication Sig Dispense Refill   acetaminophen  (TYLENOL ) 650 MG CR tablet Take 1,300 mg by mouth every 8 (eight) hours as needed for pain.     albuterol  (VENTOLIN  HFA) 108 (90 Base) MCG/ACT inhaler Inhale 2 puffs into the lungs every 6 (six) hours as needed for wheezing or shortness of breath. 8 g 0    alendronate  (FOSAMAX ) 70 MG tablet Take 1 tablet (70 mg total) by mouth every 7 (seven) days. Take with a full glass of water on an empty stomach. 4 tablet 11   ascorbic acid (VITAMIN C) 500 MG tablet Take 500 mg by mouth 2 (two) times daily.     atorvastatin  (LIPITOR) 40 MG tablet Take 1 tablet by mouth daily. 90 tablet 1   butalbital -acetaminophen -caffeine  (FIORICET ) 50-325-40 MG tablet Take 2 tablets by mouth as needed for headache. 20 tablet 0   donepezil  (ARICEPT ) 10 MG tablet Take 1 tablet (10 mg total) by mouth at bedtime. 90 tablet 2   ferrous sulfate  325 (65 FE) MG tablet Take 1 tablet (325 mg total) by mouth daily. 90 tablet 1   ketoconazole  (NIZORAL ) 2 % shampoo Three times per week, massage into scalp and leave in for 8-10 minutes before rinsing out. 120 mL 11   levothyroxine  (SYNTHROID ) 88 MCG tablet Take 1 tablet (88 mcg total) by mouth daily. 90 tablet 3   lisinopril  (ZESTRIL ) 40 MG tablet Take 1 tablet (40 mg total) by mouth daily. 90 tablet 1   loratadine (CLARITIN) 10 MG tablet Take 10 mg by mouth daily.     Omega-3 Fatty Acids (FISH OIL) 1000 MG CAPS Take 1 capsule by mouth daily.     oxybutynin  (DITROPAN -XL) 10 MG 24 hr tablet Take 1 tablet (10 mg total) by mouth in the morning and at bedtime. 60 tablet 11   pregabalin  (LYRICA ) 75 MG capsule Take 1 capsule (75 mg total) by mouth 2 (two) times daily. 180 capsule 1   Rimegepant Sulfate (NURTEC) 75 MG TBDP Place 1 tablet under the tongue every other day.     triamcinolone  cream (KENALOG ) 0.1 % Apply topically 2 (two) times daily as needed. 30 g 5   trimethoprim  (TRIMPEX ) 100 MG tablet Take 1 tablet (100 mg total) by mouth daily. 30 tablet 4   lamoTRIgine  (LAMICTAL ) 200 MG tablet Take 1 tablet (200 mg total) by mouth daily. 120 tablet 0   traZODone  (DESYREL ) 50 MG tablet Take 1  tablet (50 mg total) by mouth at bedtime. 120 tablet 0   venlafaxine  XR (EFFEXOR -XR) 150 MG 24 hr capsule Take 1 capsule (150 mg total) by mouth daily with  breakfast. 90 capsule 0   No current facility-administered medications for this visit.   Psychiatric Specialty Exam: Review of Systems  Blood pressure (!) 145/76, pulse 78, height 5' 2 (1.575 m), weight 172 lb (78 kg).Body mass index is 31.46 kg/m.  General Appearance: Fairly Groomed  Eye Contact:  Fair  Speech:  Clear and Coherent and Normal Rate  Volume:  Normal  Mood:  Euthymic  Affect:  Appropriate  Thought Process:  Coherent  Orientation:  Full (Time, Place, and Person)  Thought Content: Logical and Illogical   Suicidal Thoughts:  No  Homicidal Thoughts:  No  Memory:  Immediate;   Fair  Judgement:  Fair  Insight:  Fair  Psychomotor Activity:  Normal  Concentration:  Concentration: Fair  Recall:  Fair  Fund of Knowledge: Fair  Language: Fair  Akathisia:  NA    AIMS (if indicated): not done  Assets:  Communication Skills Desire for Improvement Financial Resources/Insurance Talents/Skills  ADL's:  Intact  Cognition: WNL  Sleep:  Fair   Metabolic Disorder Labs: Lab Results  Component Value Date   HGBA1C 5.6 01/13/2024   MPG 114 01/13/2024   MPG 117 09/12/2023   No results found for: PROLACTIN Lab Results  Component Value Date   CHOL 153 01/13/2024   TRIG 279 (H) 01/13/2024   HDL 42 (L) 01/13/2024   CHOLHDL 3.6 01/13/2024   LDLCALC 74 01/13/2024   LDLCALC 60 09/12/2023   Lab Results  Component Value Date   TSH 0.49 06/18/2024   TSH 4.56 (H) 03/10/2024    Therapeutic Level Labs: No results found for: LITHIUM No results found for: VALPROATE No results found for: CBMZ   Screenings: GAD-7    Flowsheet Row [facility] from [facility]10/20[facility] in [facility]  Total GAD-7 Score 14   PHQ2-9    Flowsheet Row Office Visit from 06/18/2024 in [facility] Office Visit from 01/13/2024 in [facility] Office Visit from 12/13/2023 in [facility] Office Visit from 09/12/2023 in [facility] Office Visit from 05/02/2023 in [facility]  PHQ-2 Total Score 1 0 0 0 0  PHQ-9 Total Score -- -- -- 0 --   Flowsheet Row ED to Hosp-Admission (Discharged) from 05/08/2023 in [facility]AL MED[facility]SURG UNIT Office Visit from 04/10/2023 in [facility] ED from 06/15/2021 in [facility] Emergency Department at [facility]  C-SSRS RISK CATEGORY No Risk Low Risk No Risk    Collaboration of Care: Collaboration of Care: Medication Management AEB medication prescription and Other provider involved in patient's care AEB internal med and dermatology chart review  Patient/Guardian was advised Release of Information must be obtained prior to any record release in order to collaborate their care with an outside provider. Patient/Guardian was advised if they have not already done so to contact the registration department to sign all necessary forms in order for us  to release information regarding their care.   Consent: Patient/Guardian gives verbal consent for treatment and assignment of benefits for services provided during this visit. Patient/Guardian expressed understanding and agreed to proceed.   Arvella CHRISTELLA Finder, MD 09/01/2024, 4:01 PM

## 2024-09-01 ENCOUNTER — Ambulatory Visit (HOSPITAL_COMMUNITY): Admitting: Psychiatry

## 2024-09-01 ENCOUNTER — Other Ambulatory Visit: Payer: Self-pay

## 2024-09-01 ENCOUNTER — Encounter (HOSPITAL_COMMUNITY): Payer: Self-pay | Admitting: Psychiatry

## 2024-09-01 VITALS — BP 145/76 | HR 78 | Ht 62.0 in | Wt 172.0 lb

## 2024-09-01 DIAGNOSIS — F319 Bipolar disorder, unspecified: Secondary | ICD-10-CM | POA: Diagnosis not present

## 2024-09-01 DIAGNOSIS — F5101 Primary insomnia: Secondary | ICD-10-CM | POA: Diagnosis not present

## 2024-09-01 MED ORDER — TRAZODONE HCL 50 MG PO TABS: 50.0000 mg | ORAL_TABLET | Freq: Every day | ORAL | 0 refills | Status: DC

## 2024-09-01 MED ORDER — VENLAFAXINE HCL ER 150 MG PO CP24: 150.0000 mg | ORAL_CAPSULE | Freq: Every day | ORAL | 0 refills | Status: DC

## 2024-09-01 MED ORDER — LAMOTRIGINE 200 MG PO TABS: 200.0000 mg | ORAL_TABLET | Freq: Every day | ORAL | 0 refills | Status: DC

## 2024-09-18 ENCOUNTER — Ambulatory Visit: Admitting: Adult Health

## 2024-09-22 ENCOUNTER — Ambulatory Visit: Admitting: Neurology

## 2024-09-23 ENCOUNTER — Ambulatory Visit: Admitting: Adult Health

## 2024-09-28 ENCOUNTER — Ambulatory Visit: Admitting: Neurology

## 2024-10-01 ENCOUNTER — Ambulatory Visit (INDEPENDENT_AMBULATORY_CARE_PROVIDER_SITE_OTHER): Admitting: Adult Health

## 2024-10-01 ENCOUNTER — Encounter: Payer: Self-pay | Admitting: Adult Health

## 2024-10-01 VITALS — BP 124/72 | HR 64 | Temp 97.8°F | Resp 18 | Ht 62.0 in | Wt 166.6 lb

## 2024-10-01 DIAGNOSIS — R419 Unspecified symptoms and signs involving cognitive functions and awareness: Secondary | ICD-10-CM | POA: Diagnosis not present

## 2024-10-01 DIAGNOSIS — M810 Age-related osteoporosis without current pathological fracture: Secondary | ICD-10-CM

## 2024-10-01 DIAGNOSIS — Z23 Encounter for immunization: Secondary | ICD-10-CM

## 2024-10-01 DIAGNOSIS — E890 Postprocedural hypothyroidism: Secondary | ICD-10-CM

## 2024-10-01 DIAGNOSIS — D509 Iron deficiency anemia, unspecified: Secondary | ICD-10-CM

## 2024-10-01 DIAGNOSIS — E785 Hyperlipidemia, unspecified: Secondary | ICD-10-CM | POA: Diagnosis not present

## 2024-10-01 DIAGNOSIS — I10 Essential (primary) hypertension: Secondary | ICD-10-CM | POA: Diagnosis not present

## 2024-10-01 DIAGNOSIS — Z720 Tobacco use: Secondary | ICD-10-CM

## 2024-10-01 DIAGNOSIS — M797 Fibromyalgia: Secondary | ICD-10-CM

## 2024-10-01 LAB — COMPREHENSIVE METABOLIC PANEL WITH GFR
AG Ratio: 2 (calc) (ref 1.0–2.5)
ALT: 11 U/L (ref 6–29)
AST: 13 U/L (ref 10–35)
Albumin: 4.8 g/dL (ref 3.6–5.1)
Alkaline phosphatase (APISO): 103 U/L (ref 37–153)
BUN: 19 mg/dL (ref 7–25)
CO2: 29 mmol/L (ref 20–32)
Calcium: 9.3 mg/dL (ref 8.6–10.4)
Chloride: 102 mmol/L (ref 98–110)
Creat: 0.99 mg/dL (ref 0.60–1.00)
Globulin: 2.4 g/dL (ref 1.9–3.7)
Glucose, Bld: 89 mg/dL (ref 65–99)
Potassium: 4.1 mmol/L (ref 3.5–5.3)
Sodium: 139 mmol/L (ref 135–146)
Total Bilirubin: 0.5 mg/dL (ref 0.2–1.2)
Total Protein: 7.2 g/dL (ref 6.1–8.1)
eGFR: 61 mL/min/1.73m2 (ref >=60)

## 2024-10-01 LAB — CBC WITH DIFFERENTIAL/PLATELET
Absolute Lymphocytes: 2065 {cells}/uL (ref 850–3900)
Absolute Monocytes: 371 {cells}/uL (ref 200–950)
Basophils Absolute: 49 {cells}/uL (ref 0–200)
Basophils Relative: 0.7 %
Eosinophils Absolute: 210 {cells}/uL (ref 15–500)
Eosinophils Relative: 3 %
HCT: 37.8 % (ref 35.0–45.0)
Hemoglobin: 12.5 g/dL (ref 11.7–15.5)
MCH: 31.4 pg (ref 27.0–33.0)
MCHC: 33.1 g/dL (ref 32.0–36.0)
MCV: 95 fL (ref 80.0–100.0)
MPV: 9.7 fL (ref 7.5–12.5)
Monocytes Relative: 5.3 %
Neutro Abs: 4305 {cells}/uL (ref 1500–7800)
Neutrophils Relative %: 61.5 %
Platelets: 186 Thousand/uL (ref 140–400)
RBC: 3.98 Million/uL (ref 3.80–5.10)
RDW: 13.1 % (ref 11.0–15.0)
Total Lymphocyte: 29.5 %
WBC: 7 Thousand/uL (ref 3.8–10.8)

## 2024-10-01 LAB — LIPID PANEL
Cholesterol: 192 mg/dL (ref <200)
HDL: 38 mg/dL — ABNORMAL LOW (ref >=50)
LDL Cholesterol (Calc): 114 mg/dL — ABNORMAL HIGH
Non-HDL Cholesterol (Calc): 154 mg/dL — ABNORMAL HIGH (ref <130)
Total CHOL/HDL Ratio: 5.1 (calc) — ABNORMAL HIGH (ref <5.0)
Triglycerides: 302 mg/dL — ABNORMAL HIGH (ref <150)

## 2024-10-01 LAB — TSH: TSH: 3.33 m[IU]/L (ref 0.40–4.50)

## 2024-10-01 NOTE — Progress Notes (Signed)
 [facility] clinic  Provider:  Jereld Serum DNP  Code Status:  Full Code  Goals of Care:     01/13/2024   11:01 AM  Advanced Directives  Does Patient Have a Medical Advance Directive? No  Would patient like information on creating a medical advance directive? No - Patient declined     Chief Complaint  Patient presents with   Medical Management of Chronic Issues    3 months follow-up, needs to discuss flu and covid vaccine.    Discussed the use of AI scribe software for clinical note transcription with the patient, who gave verbal consent to proceed.  HPI: Patient is a 72 y.o. female seen today for a 6-month follow up of chronic medical issues. She is accompanied by her daughter.  She takes lisinopril  40 mg daily for blood pressure. She does not monitor her blood pressure at home but feels well.  She has osteoporosis affecting a bone in her thigh, not connected to the hip, and takes Fosamax  70 mg weekly.   For her neurocognitive disorder, she takes Aricept  10 mg at bedtime and reports her memory is 'great' and better than her children's.  She has dyslipidemia and takes atorvastatin  40 mg daily. Her last cholesterol check in February showed elevated triglycerides at 279 mg/dL, while her cholesterol was 153 mg/dL and LDL was 74 mg/dL. She has lost weight recently and is reducing her salt and butter intake.  She takes Lyrica  75 mg twice a day and reports no pain.  She is hypothyroid and takes levothyroxine  88 mcg daily. Her TSH was last checked in July and was within normal limits. She wants her thyroid levels checked regularly due to a history of thyroid cancer and thyroidectomy.  She has emphysema and uses albuterol  as needed, although she does not believe she has any lung conditions. She smokes half a pack of cigarettes per day and has no plans to quit.  Her A1c was 5.6 in February, down from 5.7 a year ago.  She uses iron for anemia, but her last CBC was in February and  her iron levels are normal.    Past Medical History:  Diagnosis Date   Allergy    Alzheimer's dementia (HCC)    Anxiety    Arthritis    Bipolar 1 disorder (HCC)    Cancer (HCC)    Cataract    CSF leak    Depression    Hyperlipidemia    Hypertension    Neuromuscular disorder (HCC)    Osteoporosis    Sleep apnea    TBI (traumatic brain injury) (HCC)    Thyroid disease    TIA (transient ischemic attack)    Vitamin B6 deficiency    Vitamin D  deficiency     Past Surgical History:  Procedure Laterality Date   ABDOMINAL HYSTERECTOMY     bladder surgery     Bladder pacemaker   COLONOSCOPY     DG SHOULDER RIGHT COMPLETE  2023   RECTOVAGINAL FISTULA CLOSURE     ROTATOR CUFF REPAIR     THYROIDECTOMY  2004    Allergies  Allergen Reactions   Latex Rash   Other Rash    Tape made her have blisters   Wound Dressing Adhesive Rash    Outpatient Encounter Medications as of 10/01/2024  Medication Sig   acetaminophen  (TYLENOL ) 650 MG CR tablet Take 1,300 mg by mouth every 8 (eight) hours as needed for pain.   albuterol  (VENTOLIN  HFA) 108 (90 Base) MCG/ACT  inhaler Inhale 2 puffs into the lungs every 6 (six) hours as needed for wheezing or shortness of breath.   alendronate  (FOSAMAX ) 70 MG tablet Take 1 tablet (70 mg total) by mouth every 7 (seven) days. Take with a full glass of water on an empty stomach.   ascorbic acid (VITAMIN C) 500 MG tablet Take 500 mg by mouth 2 (two) times daily.   atorvastatin  (LIPITOR) 40 MG tablet Take 1 tablet by mouth daily.   butalbital -acetaminophen -caffeine  (FIORICET ) 50-325-40 MG tablet Take 2 tablets by mouth as needed for headache.   donepezil  (ARICEPT ) 10 MG tablet Take 1 tablet (10 mg total) by mouth at bedtime.   ferrous sulfate  325 (65 FE) MG tablet Take 1 tablet (325 mg total) by mouth daily.   ketoconazole  (NIZORAL ) 2 % shampoo Three times per week, massage into scalp and leave in for 8-10 minutes before rinsing out.   lamoTRIgine   (LAMICTAL ) 200 MG tablet Take 1 tablet (200 mg total) by mouth daily.   levothyroxine  (SYNTHROID ) 88 MCG tablet Take 1 tablet (88 mcg total) by mouth daily.   lisinopril  (ZESTRIL ) 40 MG tablet Take 1 tablet (40 mg total) by mouth daily.   loratadine (CLARITIN) 10 MG tablet Take 10 mg by mouth daily.   Omega-3 Fatty Acids (FISH OIL) 1000 MG CAPS Take 1 capsule by mouth daily.   oxybutynin  (DITROPAN -XL) 10 MG 24 hr tablet Take 1 tablet (10 mg total) by mouth in the morning and at bedtime.   pregabalin  (LYRICA ) 75 MG capsule Take 1 capsule (75 mg total) by mouth 2 (two) times daily.   Rimegepant Sulfate (NURTEC) 75 MG TBDP Place 1 tablet under the tongue every other day.   traZODone  (DESYREL ) 50 MG tablet Take 1 tablet (50 mg total) by mouth at bedtime.   triamcinolone  cream (KENALOG ) 0.1 % Apply topically 2 (two) times daily as needed.   trimethoprim  (TRIMPEX ) 100 MG tablet Take 1 tablet (100 mg total) by mouth daily.   venlafaxine  XR (EFFEXOR -XR) 150 MG 24 hr capsule Take 1 capsule (150 mg total) by mouth daily with breakfast.   No facility-administered encounter medications on file as of 10/01/2024.    Review of Systems:  Review of Systems  Constitutional:  Negative for appetite change, chills, fatigue and fever.  HENT:  Negative for congestion, hearing loss, rhinorrhea and sore throat.   Eyes: Negative.   Respiratory:  Negative for cough, shortness of breath and wheezing.   Cardiovascular:  Negative for chest pain, palpitations and leg swelling.  Gastrointestinal:  Negative for abdominal pain, constipation, diarrhea, nausea and vomiting.  Genitourinary:  Negative for dysuria.  Musculoskeletal:  Negative for arthralgias, back pain and myalgias.  Skin:  Negative for color change, rash and wound.  Neurological:  Negative for dizziness, weakness and headaches.  Psychiatric/Behavioral:  Negative for behavioral problems. The patient is not nervous/anxious.     Health Maintenance  Topic  Date Due   COVID-19 Vaccine (6 - 2025-26 season) 08/10/2024   Medicare Annual Wellness (AWV)  03/06/2025   Lung Cancer Screening  03/27/2025   Mammogram  03/27/2026   DTaP/Tdap/Td (4 - Td or Tdap) 03/14/2032   Pneumococcal Vaccine: 50+ Years  Completed   Influenza Vaccine  Completed   DEXA SCAN  Completed   Hepatitis C Screening  Completed   Zoster Vaccines- Shingrix  Completed   Meningococcal B Vaccine  Aged Out   Hepatitis B Vaccines 19-59 Average Risk  Discontinued   Colonoscopy  Discontinued  Physical Exam: Vitals:   10/01/24 1342  BP: 124/72  Pulse: 64  Resp: 18  Temp: 97.8 F (36.6 C)  SpO2: 98%  Weight: 166 lb 9.6 oz (75.6 kg)  Height: 5' 2 (1.575 m)   Body mass index is 30.47 kg/m. Physical Exam Constitutional:      General: She is not in acute distress.    Appearance: She is obese.  HENT:     Head: Normocephalic and atraumatic.     Nose: Nose normal.     Mouth/Throat:     Mouth: Mucous membranes are moist.  Eyes:     Conjunctiva/sclera: Conjunctivae normal.  Cardiovascular:     Rate and Rhythm: Normal rate and regular rhythm.  Pulmonary:     Effort: Pulmonary effort is normal.     Breath sounds: Normal breath sounds.  Abdominal:     General: Bowel sounds are normal.     Palpations: Abdomen is soft.  Musculoskeletal:        General: Normal range of motion.     Cervical back: Normal range of motion.  Skin:    General: Skin is warm and dry.  Neurological:     General: No focal deficit present.     Mental Status: She is alert and oriented to person, place, and time.  Psychiatric:        Mood and Affect: Mood normal.        Behavior: Behavior normal.        Thought Content: Thought content normal.        Judgment: Judgment normal.     Labs reviewed: Basic Metabolic Panel: Recent Labs    01/13/24 1138 03/10/24 1128 06/18/24 1334 10/01/24 1422  NA 139  --   --  139  K 4.4  --   --  4.1  CL 103  --   --  102  CO2 27  --   --  29   GLUCOSE 86  --   --  89  BUN 16  --   --  19  CREATININE 1.06*  --   --  0.99  CALCIUM  9.1  --   --  9.3  TSH 5.15* 4.56* 0.49 3.33   Liver Function Tests: Recent Labs    10/01/24 1422  AST 13  ALT 11  BILITOT 0.5  PROT 7.2   No results for input(s): LIPASE, AMYLASE in the last 8760 hours. No results for input(s): AMMONIA in the last 8760 hours. CBC: Recent Labs    01/13/24 1138 10/01/24 1422  WBC 6.6 7.0  NEUTROABS 3,828 4,305  HGB 12.8 12.5  HCT 38.6 37.8  MCV 93.9 95.0  PLT 202 186   Lipid Panel: Recent Labs    01/13/24 1138 10/01/24 1422  CHOL 153 192  HDL 42* 38*  LDLCALC 74 114*  TRIG 279* 302*  CHOLHDL 3.6 5.1*   Lab Results  Component Value Date   HGBA1C 5.6 01/13/2024    Procedures since last visit: No results found.  Assessment/Plan  1. Age-related osteoporosis without current pathological fracture (Primary) -  Adheres to Fosamax  regimen. - Continue Fosamax  70 mg weekly.  2. Neurocognitive disorder -  Memory reported as excellent. - Continue Aricept  10 mg at bedtime.  3. Primary hypertension -   Blood pressure well-controlled at 124/72 mmHg. - Continue lisinopril  40 mg daily. - Lipid panel - Comprehensive metabolic panel  4. Dyslipidemia -  Triglycerides elevated at 279 mg/dL, increased from last year. Cholesterol and LDL within  acceptable range. Weight loss and dietary changes noted. - Continue atorvastatin  40 mg daily.  5. Postoperative hypothyroidism -  Managed with levothyroxine . TSH within normal range in July. TSH check requested due to thyroid cancer history. - TSH  6. Iron deficiency anemia, unspecified iron deficiency anemia type -  Managed with iron supplementation. Last CBC normal. - CBC with Differential/Platelets  7. Immunization due - Flu vaccine HIGH DOSE PF(Fluzone Trivalent)  8. Tobacco use -  Smokes half a pack per day, no intention to quit. -  counseled on smoking cessation  9. Fibromyalgia -   Well-managed, no pain reported. - Continue Lyrica  75 mg twice a day.    Labs/tests ordered:  CBC, CMP, TSH, lipid panel   Return in about 3 months (around 01/01/2025).  Wasim Hurlbut Medina-Vargas, NP

## 2024-10-02 ENCOUNTER — Other Ambulatory Visit: Payer: Self-pay | Admitting: Adult Health

## 2024-10-02 ENCOUNTER — Ambulatory Visit: Payer: Self-pay | Admitting: Adult Health

## 2024-10-02 DIAGNOSIS — E785 Hyperlipidemia, unspecified: Secondary | ICD-10-CM

## 2024-10-02 MED ORDER — FENOFIBRATE 145 MG PO TABS: 145.0000 mg | ORAL_TABLET | Freq: Every day | ORAL | 1 refills | Status: AC

## 2024-10-02 NOTE — Progress Notes (Signed)
-   TSH   , electrolytes, liver enzymes, all normal - No anemia, discontinue ferrous sulfate  - LDL 114, elevated, up from 74 -   Triglycerides 302, up from 279, will add fenofibrate 145 mg daily, sent eRx to [facility] -   Continue atorvastatin  40 mg daily

## 2024-10-14 ENCOUNTER — Other Ambulatory Visit (HOSPITAL_COMMUNITY)

## 2024-10-20 ENCOUNTER — Other Ambulatory Visit: Payer: Self-pay | Admitting: Adult Health

## 2024-10-20 ENCOUNTER — Other Ambulatory Visit: Payer: Self-pay

## 2024-10-20 MED ORDER — ALENDRONATE SODIUM 70 MG PO TABS: 70.0000 mg | ORAL_TABLET | ORAL | 11 refills | Status: AC

## 2024-10-20 NOTE — Telephone Encounter (Signed)
 Incoming fax regarding medication refill alendronate  70mg , medication refilled and sent.

## 2024-11-12 ENCOUNTER — Encounter: Payer: Self-pay | Admitting: Adult Health

## 2024-11-12 NOTE — Telephone Encounter (Signed)
 Form completed and handed to Alondra.

## 2024-11-13 NOTE — Telephone Encounter (Signed)
 Spoke with Harlene and let her know we close at 3 on Fridays.

## 2024-11-14 ENCOUNTER — Other Ambulatory Visit: Payer: Self-pay | Admitting: Adult Health

## 2024-11-14 DIAGNOSIS — M797 Fibromyalgia: Secondary | ICD-10-CM

## 2024-11-16 ENCOUNTER — Other Ambulatory Visit: Payer: Self-pay | Admitting: *Deleted

## 2024-11-16 DIAGNOSIS — I10 Essential (primary) hypertension: Secondary | ICD-10-CM

## 2024-11-16 MED ORDER — LISINOPRIL 40 MG PO TABS: 40.0000 mg | ORAL_TABLET | Freq: Every day | ORAL | 1 refills | Status: AC

## 2024-11-16 NOTE — Progress Notes (Deleted)
 BH MD/PA/NP OP Progress Note  11/16/2024 9:40 AM Shannon Hart  MRN:  [id number]  Visit Diagnosis:  No diagnosis found.   Assessment: Shannon Hart is a 73 y.o. female with a history of MDD, GAD, OCPD, bipolar disorder, Alzheimer's, TBI, OSA, and vitamin D  deficiency who presented to [facility] for initial evaluation on 04/10/2023.    At initial evaluation patient reported a history of bipolar disorder with manic episodes where she has decreased sleep, racing thoughts, flight of ideas, reckless behaviors, and pressured speech which can last 3-4 days. She denies any delusions, hallucinations, paranoia, or grandiosity.  During the depressive phases patient has low mood, decreased energy, amotivation, anhedonia, and at times passive SI.  She denies any SI since 2020 when she was hospitalized.  At initial evaluation patient was having mild fluctuations between high and low phases where patient was more energetic during the up phases and spent more time in bed during the down phases.  In addition to the diagnosis of bipolar disorder patient also has past history of a TBI following oxygen 2006 and has had signs of memory loss over the past several years.  There has been concern for Alzheimer's and frontotemporal dementia based off of imaging.  Most recent MoCA was a 23 out of 30 on 01/17/23.  Shannon Hart presents for follow-up evaluation. Today, 11/16/24, patient    stable mood in the interim.  She has been compliant with her medications denying any adverse side effects.  Memory and recall still remain concerns and patient had canceled scheduled neurology appointment.  We will rerefer today and encouraged patient to keep any scheduled appointments. Given stability would be appropriate to continue on current regimen.  We will follow up in 3 months.  Plan: - Continue Lamictal  200 mg QD - Continue Venlafaxine  XR 150 mg QD - Continue Trazodone  50 mg QHS - Continue donepezil  10 mg  at bedtime managed by neurology - Continue pregabalin  50 mg BID managed by PCP - CMP, CBC, B12, and TSH reviewed - Head CT w/o contrast from 07/12/22 reviewed and showed moderate cerebral atrophy, concentrated at the frontal, and temporal greater than posterior parietal lobes. 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  -Neurology referral placed - Crisis resources reviewed - Follow up in 3 months  Chief Complaint:  No chief complaint on file.  HPI: Patient presents alongside her daughter who participated in the session with patient's permission.  She reports that    things have been fairly good over the past few months.  She can get down on occasion but overall feels like it is not affecting her as significantly it used to.  Sleeps been relatively good with patient getting around 7 hours at night in addition to 30 minutes of naps.  That said she does have difficulty with sleep initiation and might lay in bed for couple hours before falling asleep.  She is taking her medications consistently and denies any adverse side effects.  Per her daughter there has been no new concerns.  Memory issues has been an issue and they still need to connect with a neurologist.   Past Psychiatric History: Patient had been connected with a psychiatric provider at [facility] in the past over 20 years ago.  She also reports having a therapist in the past but none currently.  Patient reports that she has been hospitalized in December of 2020 for suicidal ideation.  She reports a suicide attempt at the end of 2020 and  the beginning of 2021.  Patient has tried Mirtazapine - not helpful for anxiety/depression, Lexapro, Celexa, Depakote - ineffective, Wellbutrin- ineffective, Abilify - akathesia, Seroquel - ineffective, Geodon, Prozac - ineffective, Cymbalta - caused hallucinations, Provigil, and gabapentin in the past.  Zyprexa had been discussed, but patient and family do not want this 2/2 risk of weight gain and side effects.    Lamictal  has been extremely effective for her daughter.   Patient denies any substance use other than nicotine .  She reports smoking half a pack a day since she was 22.  Past Medical History:  Past Medical History:  Diagnosis Date   Allergy    Alzheimer's dementia (HCC)    Anxiety    Arthritis    Bipolar 1 disorder (HCC)    Cancer (HCC)    Cataract    CSF leak    Depression    Hyperlipidemia    Hypertension    Neuromuscular disorder (HCC)    Osteoporosis    Sleep apnea    TBI (traumatic brain injury) (HCC)    Thyroid disease    TIA (transient ischemic attack)    Vitamin B6 deficiency    Vitamin D  deficiency     Past Surgical History:  Procedure Laterality Date   ABDOMINAL HYSTERECTOMY     bladder surgery     Bladder pacemaker   COLONOSCOPY     DG SHOULDER RIGHT COMPLETE  2023   RECTOVAGINAL FISTULA CLOSURE     ROTATOR CUFF REPAIR     THYROIDECTOMY  2004   Family History:  Family History  Problem Relation Age of Onset   Uterine cancer Mother    Colon cancer Neg Hx    Colon polyps Neg Hx    Esophageal cancer Neg Hx    Rectal cancer Neg Hx    Stomach cancer Neg Hx     Social History:  Social History   Socioeconomic History   Marital status: Divorced    Spouse name: Not on file   Number of children: Not on file   Years of education: Not on file   Highest education level: Some college, no degree  Occupational History   Not on file  Tobacco Use   Smoking status: Every Day    Current packs/day: 0.50    Average packs/day: 0.5 packs/day for 51.0 years (25.5 ttl pk-yrs)    Types: Cigarettes    Passive exposure: Current   Smokeless tobacco: Never  Vaping Use   Vaping status: Never Used  Substance and Sexual Activity   Alcohol use: Not Currently   Drug use: Never   Sexual activity: Not Currently  Other Topics Concern   Not on file  Social History Narrative   Not on file   Social Drivers of Health   Financial Resource Strain: Low Risk   (09/12/2023)   Overall Financial Resource Strain (CARDIA)    Difficulty of Paying Living Expenses: Not very hard  Food Insecurity: Food Insecurity Present (09/12/2023)   Hunger Vital Sign    Worried About Running Out of Food in the Last Year: Sometimes true    Ran Out of Food in the Last Year: Sometimes true  Transportation Needs: Unmet Transportation Needs (09/12/2023)   PRAPARE - Administrator, Civil Service (Medical): Yes    Lack of Transportation (Non-Medical): No  Physical Activity: Unknown (09/12/2023)   Exercise Vital Sign    Days of Exercise per Week: Patient declined    Minutes of Exercise per Session: Not on  file  Stress: Patient Declined (09/12/2023)   [employer] of Occupational Health - Occupational Stress Questionnaire    Feeling of Stress : Patient declined  Social Connections: Socially Isolated (09/12/2023)   Social Connection and Isolation Panel    Frequency of Communication with Friends and Family: Three times a week    Frequency of Social Gatherings with Friends and Family: Once a week    Attends Religious Services: Never    Database Administrator or Organizations: No    Attends Engineer, Structural: Not on file    Marital Status: Divorced    Allergies:  Allergies  Allergen Reactions   Latex Rash   Other Rash    Tape made her have blisters   Wound Dressing Adhesive Rash    Current Medications: Current Outpatient Medications  Medication Sig Dispense Refill   acetaminophen  (TYLENOL ) 650 MG CR tablet Take 1,300 mg by mouth every 8 (eight) hours as needed for pain.     albuterol  (VENTOLIN  HFA) 108 (90 Base) MCG/ACT inhaler Inhale 2 puffs into the lungs every 6 (six) hours as needed for wheezing or shortness of breath. 8 g 0   alendronate  (FOSAMAX ) 70 MG tablet Take 1 tablet (70 mg total) by mouth every 7 (seven) days. Take with a full glass of water on an empty stomach. 4 tablet 11   ascorbic acid (VITAMIN C) 500 MG tablet Take 500 mg by  mouth 2 (two) times daily.     atorvastatin  (LIPITOR) 40 MG tablet Take 1 tablet by mouth daily. 90 tablet 1   butalbital -acetaminophen -caffeine  (FIORICET ) 50-325-40 MG tablet Take 2 tablets by mouth as needed for headache. 20 tablet 0   donepezil  (ARICEPT ) 10 MG tablet Take 1 tablet (10 mg total) by mouth at bedtime. 90 tablet 2   fenofibrate  (TRICOR ) 145 MG tablet Take 1 tablet (145 mg total) by mouth daily. 90 tablet 1   ferrous sulfate  325 (65 FE) MG tablet Take 1 tablet (325 mg total) by mouth daily. 90 tablet 1   ketoconazole  (NIZORAL ) 2 % shampoo Three times per week, massage into scalp and leave in for 8-10 minutes before rinsing out. 120 mL 11   lamoTRIgine  (LAMICTAL ) 200 MG tablet Take 1 tablet (200 mg total) by mouth daily. 120 tablet 0   levothyroxine  (SYNTHROID ) 88 MCG tablet Take 1 tablet (88 mcg total) by mouth daily. 90 tablet 3   lisinopril  (ZESTRIL ) 40 MG tablet Take 1 tablet (40 mg total) by mouth daily. 90 tablet 1   loratadine (CLARITIN) 10 MG tablet Take 10 mg by mouth daily.     Omega-3 Fatty Acids (FISH OIL) 1000 MG CAPS Take 1 capsule by mouth daily.     oxybutynin  (DITROPAN -XL) 10 MG 24 hr tablet Take 1 tablet (10 mg total) by mouth in the morning and at bedtime. 60 tablet 11   pregabalin  (LYRICA ) 75 MG capsule Take 1 capsule (75 mg total) by mouth 2 (two) times daily. 180 capsule 1   Rimegepant Sulfate (NURTEC) 75 MG TBDP Place 1 tablet under the tongue every other day.     traZODone  (DESYREL ) 50 MG tablet Take 1 tablet (50 mg total) by mouth at bedtime. 120 tablet 0   triamcinolone  cream (KENALOG ) 0.1 % Apply topically 2 (two) times daily as needed. 30 g 5   trimethoprim  (TRIMPEX ) 100 MG tablet Take 1 tablet (100 mg total) by mouth daily. 30 tablet 4   venlafaxine  XR (EFFEXOR -XR) 150 MG 24 hr capsule Take 1  capsule (150 mg total) by mouth daily with breakfast. 90 capsule 0   No current facility-administered medications for this visit.   Psychiatric Specialty  Exam: Review of Systems  There were no vitals taken for this visit.There is no height or weight on file to calculate BMI.  General Appearance: Fairly Groomed  Eye Contact:  Fair  Speech:  Clear and Coherent and Normal Rate  Volume:  Normal  Mood:  Euthymic  Affect:  Appropriate  Thought Process:  Coherent  Orientation:  Full (Time, Place, and Person)  Thought Content: Logical and Illogical   Suicidal Thoughts:  No  Homicidal Thoughts:  No  Memory:  Immediate;   Fair  Judgement:  Fair  Insight:  Fair  Psychomotor Activity:  Normal  Concentration:  Concentration: Fair  Recall:  Fair  Fund of Knowledge: Fair  Language: Fair  Akathisia:  NA    AIMS (if indicated): not done  Assets:  Communication Skills Desire for Improvement Financial Resources/Insurance Talents/Skills  ADL's:  Intact  Cognition: WNL  Sleep:  Fair   Metabolic Disorder Labs: Lab Results  Component Value Date   HGBA1C 5.6 01/13/2024   MPG 114 01/13/2024   MPG 117 09/12/2023   No results found for: PROLACTIN Lab Results  Component Value Date   CHOL 192 10/01/2024   TRIG 302 (H) 10/01/2024   HDL 38 (L) 10/01/2024   CHOLHDL 5.1 (H) 10/01/2024   LDLCALC 114 (H) 10/01/2024   LDLCALC 74 01/13/2024   Lab Results  Component Value Date   TSH 3.33 10/01/2024   TSH 0.49 06/18/2024    Therapeutic Level Labs: No results found for: LITHIUM No results found for: VALPROATE No results found for: CBMZ   Screenings: GAD-7    Flowsheet Row [facility]  Total GAD-7 Score 14   PHQ2-9    Flowsheet Row Office Visit from 06/18/2024 in [facility] Office Visit from 01/13/2024 in [facility] Office Visit from 12/13/2023 in [facility] Office Visit from 09/12/2023 in [facility] Office Visit  from 05/02/2023 in [facility] Medicine  PHQ-2 Total Score 1 0 0 0 0  PHQ-9 Total Score -- -- -- 0 --   Flowsheet Row ED to Hosp-Admission (Discharged) from 05/08/2023 in [facility] GENERAL MED/SURG UNIT Office Visit from 04[facility]10/2023 [facility] ED from 06/15/2021 in [facility] Emergency Department at [facility]  C-SSRS RISK CATEGORY No Risk Low Risk No Risk    Collaboration of Care: Collaboration of Care: Medication Management AEB medication prescription and Other provider involved in patient's care AEB internal med chart review  Patient/Guardian was advised Release of Information must be obtained prior to any record release in order to collaborate their care with an outside provider. Patient/Guardian was advised if they have not already done so to contact the registration department to sign all necessary forms in order for us  to release information regarding their care.   Consent: Patient/Guardian gives verbal consent for treatment and assignment of benefits for services provided during this visit. Patient/Guardian expressed understanding and agreed to proceed.   Arvella CHRISTELLA Finder, MD 11/16/2024, 9:40 AM

## 2024-11-16 NOTE — Telephone Encounter (Signed)
 Can you pls double check if she has a contract for Lyrica ? I don't see one.

## 2024-11-16 NOTE — Telephone Encounter (Signed)
 Received refill Request from [facility]

## 2024-11-16 NOTE — Telephone Encounter (Signed)
 Patient is requesting a refill of the following medications: Requested Prescriptions   Pending Prescriptions Disp Refills   pregabalin  (LYRICA ) 75 MG capsule 180 capsule 0    Sig: Take 1 capsule by mouth twice daily.    Date of last refill: 06/18/24  Refill amount: 180/1  Treatment agreement date: No treatment agreement on file, notation made on pending appointment for 01/01/25

## 2024-11-16 NOTE — Telephone Encounter (Signed)
 Closing encounter. Patient is scheduled to be seen on 01/01/25 and a treatment agreement will be signed at that time.

## 2024-11-16 NOTE — Telephone Encounter (Signed)
Picked up by daughter.

## 2024-11-17 ENCOUNTER — Ambulatory Visit (HOSPITAL_COMMUNITY): Admitting: Psychiatry

## 2024-11-17 ENCOUNTER — Encounter: Payer: Self-pay | Admitting: Adult Health

## 2024-11-17 ENCOUNTER — Telehealth: Payer: Self-pay | Admitting: Urology

## 2024-11-17 ENCOUNTER — Encounter (HOSPITAL_COMMUNITY): Payer: Self-pay

## 2024-11-17 NOTE — Telephone Encounter (Signed)
 [facility] uses [facility] - they prefer 90 day orders

## 2024-11-17 NOTE — Telephone Encounter (Signed)
 Pt needs a refill for Oxybutynin  sent to  [email or web address]

## 2024-11-18 NOTE — Telephone Encounter (Signed)
Called patient and left a voicemail requesting a call back.

## 2024-11-23 NOTE — Telephone Encounter (Signed)
 Form printed from message sent by patient

## 2024-11-23 NOTE — Progress Notes (Unsigned)
 BH MD/PA/NP OP Progress Note  11/24/2024 10:27 AM Shannon Hart  MRN:  [id number]  Visit Diagnosis:    ICD-10-CM   1. Primary insomnia  F51.01 traZODone  (DESYREL ) 50 MG tablet    2. Bipolar 1 disorder (HCC)  F31.9 lamoTRIgine  (LAMICTAL ) 200 MG tablet    venlafaxine  XR (EFFEXOR -XR) 150 MG 24 hr capsule       Assessment: Shannon Hart is a 73 y.o. female with a history of MDD, GAD, OCPD, bipolar disorder, Alzheimer's, TBI, OSA, and vitamin D  deficiency who presented to [facility] at [facility] for initial evaluation on 04/10/2023.    At initial evaluation patient reported a history of bipolar disorder with manic episodes where she has decreased sleep, racing thoughts, flight of ideas, reckless behaviors, and pressured speech which can last 3-4 days. She denies any delusions, hallucinations, paranoia, or grandiosity.  During the depressive phases patient has low mood, decreased energy, amotivation, anhedonia, and at times passive SI.  She denies any SI since 2020 when she was hospitalized.  At initial evaluation patient was having mild fluctuations between high and low phases where patient was more energetic during the up phases and spent more time in bed during the down phases.  In addition to the diagnosis of bipolar disorder patient also has past history of a TBI following oxygen 2006 and has had signs of memory loss over the past several years.  There has been concern for Alzheimer's and frontotemporal dementia based off of imaging.  Most recent MoCA was a 23 out of 30 on 01/17/23.  Shannon Hart presents for follow-up evaluation. Today, 11/24/2024, patient mood remains stable and euthymic.  She has been engaging in increased behavioral activation and exercise.  Patient been compliant with medications denies any adverse side effects.  While memory/recall still remain concerns patient is engaging in appropriate activities to help stabilize.  We will rerefer to neurology.  Will continue  on current regimen and follow-up in 3 months.  Can consider tapering venlafaxine  if mood still upbeat.  Plan: - Continue Lamictal  200 mg QD - Continue Venlafaxine  XR 150 mg QD - Continue Trazodone  50 mg QHS - Continue donepezil  10 mg at bedtime managed by neurology - Continue pregabalin  50 mg BID managed by PCP - CMP, CBC, B12, and TSH reviewed - Head CT w/o contrast from 07/12/22 reviewed and showed moderate cerebral atrophy, concentrated at the frontal, and temporal greater than posterior parietal lobes. 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  - Neurology referral placed - Crisis resources reviewed - Follow up in 3 months  Chief Complaint:  Chief Complaint  Patient presents with   Follow-up   HPI: Patient presents alongside her daughter who participated in the session with patient's permission.  She reports that she is doing pretty good this year, but December is typically tough. Her dad committed suicide in the past in January and she had cleaned it up after.  On top of that her dog is sick with cancer and may pass away in the next few months.  Despite all this she reports doing well and she attributes this to beginning at the senior center.  Now patient goes daily and has really enjoyed the increased activity and socialization.  She is playing card games, participating in exercise programs, and going on field trips.  Taking her medication consistently no adverse side effects. Discussed possibility of tapering the Venlafaxine  at next visit if she is still doing well.   Past Psychiatric History: Patient had been connected  with a psychiatric provider at [facility] in the past over 20 years ago.  She also reports having a therapist in the past but none currently.  Patient reports that she has been hospitalized in December of 2020 for suicidal ideation.  She reports a suicide attempt at the end of 2020 and the beginning of 2021.  Patient has tried Mirtazapine - not helpful for anxiety/depression,  Lexapro, Celexa, Depakote - ineffective, Wellbutrin- ineffective, Abilify - akathesia, Seroquel - ineffective, Geodon, Prozac - ineffective, Cymbalta - caused hallucinations, Provigil, and gabapentin in the past.  Zyprexa had been discussed, but patient and family do not want this 2/2 risk of weight gain and side effects.   Lamictal  has been extremely effective for her daughter.   Patient denies any substance use other than nicotine .  She reports smoking half a pack a day since she was 22.  Past Medical History:  Past Medical History:  Diagnosis Date   Allergy    Alzheimer's dementia (HCC)    Anxiety    Arthritis    Bipolar 1 disorder (HCC)    Cancer (HCC)    Cataract    CSF leak    Depression    Hyperlipidemia    Hypertension    Neuromuscular disorder (HCC)    Osteoporosis    Sleep apnea    TBI (traumatic brain injury) (HCC)    Thyroid disease    TIA (transient ischemic attack)    Vitamin B6 deficiency    Vitamin D  deficiency     Past Surgical History:  Procedure Laterality Date   ABDOMINAL HYSTERECTOMY     bladder surgery     Bladder pacemaker   COLONOSCOPY     DG SHOULDER RIGHT COMPLETE  2023   RECTOVAGINAL FISTULA CLOSURE     ROTATOR CUFF REPAIR     THYROIDECTOMY  2004   Family History:  Family History  Problem Relation Age of Onset   Uterine cancer Mother    Colon cancer Neg Hx    Colon polyps Neg Hx    Esophageal cancer Neg Hx    Rectal cancer Neg Hx    Stomach cancer Neg Hx     Social History:  Social History   Socioeconomic History   Marital status: Divorced    Spouse name: Not on file   Number of children: Not on file   Years of education: Not on file   Highest education level: Some college, no degree  Occupational History   Not on file  Tobacco Use   Smoking status: Every Day    Current packs/day: 0.50    Average packs/day: 0.5 packs/day for 51.0 years (25.5 ttl pk-yrs)    Types: Cigarettes    Passive exposure: Current   Smokeless  tobacco: Never  Vaping Use   Vaping status: Never Used  Substance and Sexual Activity   Alcohol use: Not Currently   Drug use: Never   Sexual activity: Not Currently  Other Topics Concern   Not on file  Social History Narrative   Not on file   Social Drivers of Health   Tobacco Use: High Risk (10/01/2024)   Patient History    Smoking Tobacco Use: Every Day    Smokeless Tobacco Use: Never    Passive Exposure: Current  Financial Resource Strain: Low Risk (09/12/2023)   Overall Financial Resource Strain (CARDIA)    Difficulty of Paying Living Expenses: Not very hard  Food Insecurity: Food Insecurity Present (09/12/2023)   Hunger Vital Sign  Worried About Programme Researcher, Broadcasting/film/video in the Last Year: Sometimes true    [employer] of Food in the Last Year: Sometimes true  Transportation Needs: Unmet Transportation Needs (09/12/2023)   PRAPARE - Administrator, Civil Service (Medical): Yes    Lack of Transportation (Non-Medical): No  Physical Activity: Unknown (09/12/2023)   Exercise Vital Sign    Days of Exercise per Week: Patient declined    Minutes of Exercise per Session: Not on file  Stress: Patient Declined (09/12/2023)   [employer] of Occupational Health - Occupational Stress Questionnaire    Feeling of Stress : Patient declined  Social Connections: Socially Isolated (09/12/2023)   Social Connection and Isolation Panel    Frequency of Communication with Friends and Family: Three times a week    Frequency of Social Gatherings with Friends and Family: Once a week    Attends Religious Services: Never    Database Administrator or Organizations: No    Attends Engineer, Structural: Not on file    Marital Status: Divorced  Depression (PHQ2-9): Low Risk (06/18/2024)   Depression (PHQ2-9)    PHQ-2 Score: 1  Alcohol Screen: Not on file  Housing: Medium Risk (09/12/2023)   Housing    Last Housing Risk Score: 1  Utilities: Not At Risk (05/08/2023)   AHC Utilities     Threatened with loss of utilities: No  Health Literacy: Not on file    Allergies:  Allergies  Allergen Reactions   Latex Rash   Other Rash    Tape made her have blisters   Wound Dressing Adhesive Rash    Current Medications: Current Outpatient Medications  Medication Sig Dispense Refill   acetaminophen  (TYLENOL ) 650 MG CR tablet Take 1,300 mg by mouth every 8 (eight) hours as needed for pain.     albuterol  (VENTOLIN  HFA) 108 (90 Base) MCG/ACT inhaler Inhale 2 puffs into the lungs every 6 (six) hours as needed for wheezing or shortness of breath. 8 g 0   alendronate  (FOSAMAX ) 70 MG tablet Take 1 tablet (70 mg total) by mouth every 7 (seven) days. Take with a full glass of water on an empty stomach. 4 tablet 11   ascorbic acid (VITAMIN C) 500 MG tablet Take 500 mg by mouth 2 (two) times daily.     atorvastatin  (LIPITOR) 40 MG tablet Take 1 tablet by mouth daily. 90 tablet 1   butalbital -acetaminophen -caffeine  (FIORICET ) 50-325-40 MG tablet Take 2 tablets by mouth as needed for headache. 20 tablet 0   donepezil  (ARICEPT ) 10 MG tablet Take 1 tablet (10 mg total) by mouth at bedtime. 90 tablet 2   ketoconazole  (NIZORAL ) 2 % shampoo Three times per week, massage into scalp and leave in for 8-10 minutes before rinsing out. 120 mL 11   levothyroxine  (SYNTHROID ) 88 MCG tablet Take 1 tablet (88 mcg total) by mouth daily. 90 tablet 3   lisinopril  (ZESTRIL ) 40 MG tablet Take 1 tablet (40 mg total) by mouth daily. 90 tablet 1   Omega-3 Fatty Acids (FISH OIL) 1000 MG CAPS Take 1 capsule by mouth daily.     oxybutynin  (DITROPAN -XL) 10 MG 24 hr tablet Take 1 tablet (10 mg total) by mouth in the morning and at bedtime. 60 tablet 11   pregabalin  (LYRICA ) 75 MG capsule Take 1 capsule by mouth twice daily. 180 capsule 0   Rimegepant Sulfate (NURTEC) 75 MG TBDP Place 1 tablet under the tongue every other day.  triamcinolone  cream (KENALOG ) 0.1 % Apply topically 2 (two) times daily as needed. 30 g 5    trimethoprim  (TRIMPEX ) 100 MG tablet Take 1 tablet (100 mg total) by mouth daily. 30 tablet 4   fenofibrate  (TRICOR ) 145 MG tablet Take 1 tablet (145 mg total) by mouth daily. 90 tablet 1   ferrous sulfate  325 (65 FE) MG tablet Take 1 tablet (325 mg total) by mouth daily. (Patient not taking: Reported on 11/24/2024) 90 tablet 1   lamoTRIgine  (LAMICTAL ) 200 MG tablet Take 1 tablet (200 mg total) by mouth daily. 120 tablet 0   loratadine (CLARITIN) 10 MG tablet Take 10 mg by mouth daily.     traZODone  (DESYREL ) 50 MG tablet Take 1 tablet (50 mg total) by mouth at bedtime. 120 tablet 0   venlafaxine  XR (EFFEXOR -XR) 150 MG 24 hr capsule Take 1 capsule (150 mg total) by mouth daily with breakfast. 90 capsule 0   No current facility-administered medications for this visit.   Psychiatric Specialty Exam: Review of Systems  Blood pressure (!) 156/82, pulse 61, height 5' 4 (1.626 m), weight 172 lb (78 kg).Body mass index is 29.52 kg/m.  General Appearance: Fairly Groomed  Eye Contact:  Fair  Speech:  Clear and Coherent and Normal Rate  Volume:  Normal  Mood:  Euthymic  Affect:  Appropriate  Thought Process:  Coherent  Orientation:  Full (Time, Place, and Person)  Thought Content: Logical and Illogical   Suicidal Thoughts:  No  Homicidal Thoughts:  No  Memory:  Immediate;   Fair  Judgement:  Fair  Insight:  Fair  Psychomotor Activity:  Normal  Concentration:  Concentration: Fair  Recall:  Fair  Fund of Knowledge: Fair  Language: Fair  Akathisia:  NA    AIMS (if indicated): not done  Assets:  Communication Skills Desire for Improvement Financial Resources/Insurance Talents/Skills  ADL's:  Intact  Cognition: WNL  Sleep:  Fair   Metabolic Disorder Labs: Lab Results  Component Value Date   HGBA1C 5.6 01/13/2024   MPG 114 01/13/2024   MPG 117 09/12/2023   No results found for: PROLACTIN Lab Results  Component Value Date   CHOL 192 10/01/2024   TRIG 302 (H) 10/01/2024   HDL  38 (L) 10/01/2024   CHOLHDL 5.1 (H) 10/01/2024   LDLCALC 114 (H) 10/01/2024   LDLCALC 74 01/13/2024   Lab Results  Component Value Date   TSH 3.33 10/01/2024   TSH 0.49 06/18/2024    Therapeutic Level Labs: No results found for: LITHIUM No results found for: VALPROATE No results found for: CBMZ   Screenings: GAD-7    Flowsheet Row Office Visit from [facility]/20[facility] in [facility]  Total GAD-7 Score 14   PHQ2-9    Flowsheet Row Office Visit from 06/18/2024 in [facility] Office Visit from 01/13/2024 in [facility] Office Visit from 12/13/2023 in [facility] Office Visit from 09/12/2023 in [facility] Office Visit from 05/02/2023 in [facility]  PHQ-2 Total Score 1 0 0 0 0  PHQ-9 Total Score -- -- -- 0 --   Flowsheet Row ED to Hosp-Admission (Discharged) from 05/08/2023 in [facility] GENERAL MED/SURG UNIT Office Visit from 04/10/2023 in [facility] ED from 06/15/2021 in [facility] Emergency Department at [facility]  C-SSRS RISK  CATEGORY No Risk Low Risk No Risk    Collaboration of Care: Collaboration of Care: Medication Management AEB medication prescription and Other provider involved in patient's care AEB internal med chart review  Patient/Guardian was advised Release of Information must be obtained prior to any record release in order to collaborate their care with an outside provider. Patient/Guardian was advised if they have not already done so to contact the registration department to sign all necessary forms in order for us  to release information regarding their care.   Consent: Patient/Guardian gives verbal consent for treatment and assignment of benefits for services provided during this visit.  Patient/Guardian expressed understanding and agreed to proceed.   Arvella CHRISTELLA Finder, MD 11/24/2024, 10:27 AM

## 2024-11-23 NOTE — Telephone Encounter (Signed)
 Form completed and put to for faxing box.

## 2024-11-24 ENCOUNTER — Ambulatory Visit (HOSPITAL_COMMUNITY): Admitting: Psychiatry

## 2024-11-24 ENCOUNTER — Other Ambulatory Visit: Payer: Self-pay

## 2024-11-24 ENCOUNTER — Encounter (HOSPITAL_COMMUNITY): Payer: Self-pay | Admitting: Psychiatry

## 2024-11-24 DIAGNOSIS — F319 Bipolar disorder, unspecified: Secondary | ICD-10-CM

## 2024-11-24 DIAGNOSIS — F5101 Primary insomnia: Secondary | ICD-10-CM | POA: Diagnosis not present

## 2024-11-24 MED ORDER — VENLAFAXINE HCL ER 150 MG PO CP24: 150.0000 mg | ORAL_CAPSULE | Freq: Every day | ORAL | 0 refills | Status: AC

## 2024-11-24 MED ORDER — TRAZODONE HCL 50 MG PO TABS: 50.0000 mg | ORAL_TABLET | Freq: Every day | ORAL | 0 refills | Status: AC

## 2024-11-24 MED ORDER — LAMOTRIGINE 200 MG PO TABS: 200.0000 mg | ORAL_TABLET | Freq: Every day | ORAL | 0 refills | Status: AC

## 2024-11-27 MED ORDER — OXYBUTYNIN CHLORIDE ER 10 MG PO TB24: 10.0000 mg | ORAL_TABLET | Freq: Two times a day (BID) | ORAL | 0 refills | Status: DC

## 2024-11-27 NOTE — Telephone Encounter (Signed)
 30 day of oxybutynin  sent, pt's daughter aware.

## 2024-12-11 ENCOUNTER — Ambulatory Visit: Admitting: Physician Assistant

## 2024-12-14 ENCOUNTER — Other Ambulatory Visit: Payer: Self-pay | Admitting: Adult Health

## 2024-12-14 ENCOUNTER — Other Ambulatory Visit: Payer: Self-pay | Admitting: *Deleted

## 2024-12-14 DIAGNOSIS — E890 Postprocedural hypothyroidism: Secondary | ICD-10-CM

## 2024-12-14 MED ORDER — LEVOTHYROXINE SODIUM 88 MCG PO TABS: 88.0000 ug | ORAL_TABLET | Freq: Every day | ORAL | 1 refills | Status: AC

## 2024-12-14 MED ORDER — ATORVASTATIN CALCIUM 40 MG PO TABS: 40.0000 mg | ORAL_TABLET | Freq: Every day | ORAL | 1 refills | Status: AC

## 2024-12-14 NOTE — Telephone Encounter (Signed)
Pill Pack requested refill.

## 2024-12-14 NOTE — Addendum Note (Signed)
 Addended by: LYNNANN COUGH A on: 12/14/2024 09:49 AM   Modules accepted: Orders

## 2024-12-22 ENCOUNTER — Encounter: Payer: Self-pay | Admitting: Physician Assistant

## 2024-12-22 ENCOUNTER — Ambulatory Visit: Admitting: Physician Assistant

## 2024-12-22 VITALS — BP 130/79 | HR 67 | Ht 64.0 in | Wt 167.0 lb

## 2024-12-22 DIAGNOSIS — N3281 Overactive bladder: Secondary | ICD-10-CM

## 2024-12-22 MED ORDER — OXYBUTYNIN CHLORIDE ER 10 MG PO TB24: 10.0000 mg | ORAL_TABLET | Freq: Two times a day (BID) | ORAL | 11 refills | Status: AC

## 2024-12-22 NOTE — Progress Notes (Signed)
 "  12/22/2024 4:07 PM   Canda Ey 04-29-1951 [id number]  CC: Chief Complaint  Patient presents with   Follow-up    Med refill symptom recheck   HPI: Shannon Hart is a 74 y.o. female with PMH OAB wet with mixed urge and stress incontinence on oxybutynin  XL 10 mg twice daily s/p InterStim, POP s/p bladder suspension, dementia, and OSA who presents today for routine follow-up.   She was seen in our clinic by Dr. Gaston most recently on 11/11/2023.  She subsequently underwent InterStim placement in [place] with him.  Today she reports he is doing exceedingly well after InterStim placement and is very pleased.  She is nearly dry on this combined with oxybutynin .  She recently ran out of the medication and was off it for about a month and developed worsening urinary leakage off of it.  PMH: Past Medical History:  Diagnosis Date   Allergy    Alzheimer's dementia (HCC)    Anxiety    Arthritis    Bipolar 1 disorder (HCC)    Cancer (HCC)    Cataract    CSF leak    Depression    Hyperlipidemia    Hypertension    Neuromuscular disorder (HCC)    Osteoporosis    Sleep apnea    TBI (traumatic brain injury) (HCC)    Thyroid disease    TIA (transient ischemic attack)    Vitamin B6 deficiency    Vitamin D  deficiency     Surgical History: Past Surgical History:  Procedure Laterality Date   ABDOMINAL HYSTERECTOMY     bladder surgery     Bladder pacemaker   COLONOSCOPY     DG SHOULDER RIGHT COMPLETE  2023   RECTOVAGINAL FISTULA CLOSURE     ROTATOR CUFF REPAIR     THYROIDECTOMY  2004    Home Medications:  Allergies as of 12/22/2024       Reactions   Latex Rash   Other Rash   Tape made her have blisters   Wound Dressing Adhesive Rash        Medication List        Accurate as of December 22, 2024  4:07 PM. If you have any questions, ask your nurse or doctor.          acetaminophen  650 MG CR tablet Commonly known as: TYLENOL  Take 1,300 mg by mouth every 8  (eight) hours as needed for pain.   albuterol  108 (90 Base) MCG/ACT inhaler Commonly known as: VENTOLIN  HFA Inhale 2 puffs into the lungs every 6 (six) hours as needed for wheezing or shortness of breath.   alendronate  70 MG tablet Commonly known as: FOSAMAX  Take 1 tablet (70 mg total) by mouth every 7 (seven) days. Take with a full glass of water on an empty stomach.   ascorbic acid 500 MG tablet Commonly known as: VITAMIN C Take 500 mg by mouth 2 (two) times daily.   atorvastatin  40 MG tablet Commonly known as: LIPITOR Take 1 tablet (40 mg total) by mouth daily.   butalbital -acetaminophen -caffeine  50-325-40 MG tablet Commonly known as: FIORICET  Take 2 tablets by mouth as needed for headache.   donepezil  10 MG tablet Commonly known as: ARICEPT  Take 1 tablet (10 mg total) by mouth at bedtime.   fenofibrate  145 MG tablet Commonly known as: TRICOR  Take 1 tablet (145 mg total) by mouth daily.   ferrous sulfate  325 (65 FE) MG tablet Take 1 tablet (325 mg total) by mouth daily.   Fish  Oil 1000 MG Caps Take 1 capsule by mouth daily.   ketoconazole  2 % shampoo Commonly known as: NIZORAL  Three times per week, massage into scalp and leave in for 8-10 minutes before rinsing out.   lamoTRIgine  200 MG tablet Commonly known as: LAMICTAL  Take 1 tablet (200 mg total) by mouth daily.   levothyroxine  88 MCG tablet Commonly known as: SYNTHROID  Take 1 tablet (88 mcg total) by mouth daily.   lisinopril  40 MG tablet Commonly known as: ZESTRIL  Take 1 tablet (40 mg total) by mouth daily.   loratadine 10 MG tablet Commonly known as: CLARITIN Take 10 mg by mouth daily.   Nurtec 75 MG Tbdp Generic drug: Rimegepant Sulfate Place 1 tablet under the tongue every other day.   oxybutynin  10 MG 24 hr tablet Commonly known as: DITROPAN -XL Take 1 tablet (10 mg total) by mouth in the morning and at bedtime.   pregabalin  75 MG capsule Commonly known as: LYRICA  Take 1 capsule by mouth  twice daily.   traZODone  50 MG tablet Commonly known as: DESYREL  Take 1 tablet (50 mg total) by mouth at bedtime.   triamcinolone  cream 0.1 % Commonly known as: KENALOG  Apply topically 2 (two) times daily as needed.   trimethoprim  100 MG tablet Commonly known as: TRIMPEX  Take 1 tablet (100 mg total) by mouth daily.   venlafaxine  XR 150 MG 24 hr capsule Commonly known as: EFFEXOR -XR Take 1 capsule (150 mg total) by mouth daily with breakfast.        Allergies:  Allergies[1]  Family History: Family History  Problem Relation Age of Onset   Uterine cancer Mother    Colon cancer Neg Hx    Colon polyps Neg Hx    Esophageal cancer Neg Hx    Rectal cancer Neg Hx    Stomach cancer Neg Hx     Social History:   reports that she has been smoking cigarettes. She has a 25.5 pack-year smoking history. She has been exposed to tobacco smoke. She has never used smokeless tobacco. She reports that she does not currently use alcohol. She reports that she does not use drugs.  Physical Exam: BP (!) 161/80   Pulse 71   Ht 5' 4 (1.626 m)   Wt 167 lb (75.8 kg)   BMI 28.67 kg/m   Constitutional:  Alert and oriented, no acute distress, nontoxic appearing HEENT: , AT Cardiovascular: No clubbing, cyanosis, or edema Respiratory: Normal respiratory effort, no increased work of breathing Skin: No rashes, bruises or suspicious lesions Neurologic: Grossly intact, no focal deficits, moving all 4 extremities Psychiatric: Normal mood and affect  Assessment & Plan:   1. OAB (overactive bladder) (Primary) Symptoms well-controlled with InterStim and oxybutynin .  Will continue this and see her back for annual follow-up. - oxybutynin  (DITROPAN -XL) 10 MG 24 hr tablet; Take 1 tablet (10 mg total) by mouth in the morning and at bedtime.  Dispense: 60 tablet; Refill: 11   Return in about 1 year (around 12/22/2025) for Annual OAB f/u with P[facility].  Lucie Hones, PA-C  [facility]  [place] [address], [address] [place], [place] [address] [phone]     [1]  Allergies Allergen Reactions   Latex Rash   Other Rash    Tape made her have blisters   Wound Dressing Adhesive Rash   "

## 2025-01-01 ENCOUNTER — Ambulatory Visit: Payer: Self-pay | Admitting: Adult Health

## 2025-01-01 ENCOUNTER — Other Ambulatory Visit: Payer: Self-pay | Admitting: Adult Health

## 2025-01-01 ENCOUNTER — Encounter: Payer: Self-pay | Admitting: Adult Health

## 2025-01-01 ENCOUNTER — Encounter: Payer: Self-pay | Admitting: Neurology

## 2025-01-01 VITALS — BP 140/70 | Temp 97.5°F | Ht 64.0 in | Wt 167.2 lb

## 2025-01-01 DIAGNOSIS — F0390 Unspecified dementia without behavioral disturbance: Secondary | ICD-10-CM

## 2025-01-01 DIAGNOSIS — F3170 Bipolar disorder, currently in remission, most recent episode unspecified: Secondary | ICD-10-CM | POA: Diagnosis not present

## 2025-01-01 DIAGNOSIS — E782 Mixed hyperlipidemia: Secondary | ICD-10-CM | POA: Diagnosis not present

## 2025-01-01 DIAGNOSIS — E890 Postprocedural hypothyroidism: Secondary | ICD-10-CM

## 2025-01-01 DIAGNOSIS — F17200 Nicotine dependence, unspecified, uncomplicated: Secondary | ICD-10-CM | POA: Diagnosis not present

## 2025-01-01 DIAGNOSIS — I10 Essential (primary) hypertension: Secondary | ICD-10-CM | POA: Diagnosis not present

## 2025-01-01 DIAGNOSIS — N3281 Overactive bladder: Secondary | ICD-10-CM | POA: Diagnosis not present

## 2025-01-01 DIAGNOSIS — R413 Other amnesia: Secondary | ICD-10-CM

## 2025-01-01 DIAGNOSIS — M810 Age-related osteoporosis without current pathological fracture: Secondary | ICD-10-CM | POA: Diagnosis not present

## 2025-01-01 MED ORDER — DONEPEZIL HCL 10 MG PO TABS: 10.0000 mg | ORAL_TABLET | Freq: Every day | ORAL | 3 refills | Status: AC

## 2025-01-01 NOTE — Telephone Encounter (Signed)
 eRx for donepezil  sent.

## 2025-01-01 NOTE — Progress Notes (Signed)
 "  [facility]  Provider:  Jereld Serum DNP  Code Status:  Full Code  Goals of Care:     01/13/2024   11:01 AM  Advanced Directives  Does Patient Have a Medical Advance Directive? No  Would patient like information on creating a medical advance directive? No - Patient declined     Chief Complaint  Patient presents with   Medical Management of Chronic Issues    3 month followup     Discussed the use of AI scribe software for clinical note transcription with the patient, who gave verbal consent to proceed.  HPI: Patient is a 74 y.o. female seen today for a 73-month follow up of chronic medical issues. She is accompanied by her daughter.  She reports doing well overall but mentions that her neurological disorder, which she describes as similar to Alzheimer's, can be frustrating for her family. She reports having a neurological disorder that she describes as similar to Alzheimer's and takes donepezil  10 mg daily. Her memory is reportedly good, and she does not cook or drive, receiving Meals on Wheels.  She reports a history of hypothyroidism and her TSH level was 3.33 on October 01, 2024. She takes levothyroxine  88 micrograms daily.  She reports a history of hypertension, with her blood pressure today at 156/82, though it is usually around 130/80 at home. She takes lisinopril  40 mg daily.  She reports seeing a urologist recently and is taking oxybutynin . She is on oxybutynin  10 mg 24-hour and uses PTNS, which she reports works well.  She reports taking Fosamax  once a week on Sundays.  She reports taking atorvastatin  40 mg daily for elevated cholesterol. Her last lipid panel check on October 01, 2024, showed elevated triglycerides and LDL. She is trying to eat less, cut meals in half, and follow a low-fat diet with lots of vegetables. She also exercises regularly.  She reports taking venlafaxine  150 mg daily, trazodone  50 mg at bedtime, Lyrica  75 mg twice a day, and Lamictal  200  mg daily. She reports doing well with no complaints.  She smokes about 10 to 14 cigarettes a day and has been smoking for 50 years. She undergoes annual lung cancer screening and her last chest CT was on Apr 24, 2024. She does not drink alcohol.  No headaches or swelling in the feet.    Past Medical History:  Diagnosis Date   Allergy    Alzheimer's dementia (HCC)    Anxiety    Arthritis    Bipolar 1 disorder (HCC)    Cancer (HCC)    Cataract    CSF leak    Depression    Hyperlipidemia    Hypertension    Neuromuscular disorder (HCC)    Osteoporosis    Sleep apnea    TBI (traumatic brain injury) (HCC)    Thyroid disease    TIA (transient ischemic attack)    Vitamin B6 deficiency    Vitamin D  deficiency     Past Surgical History:  Procedure Laterality Date   ABDOMINAL HYSTERECTOMY     bladder surgery     Bladder pacemaker   COLONOSCOPY     DG SHOULDER RIGHT COMPLETE  2023   RECTOVAGINAL FISTULA CLOSURE     ROTATOR CUFF REPAIR     THYROIDECTOMY  2004    Allergies[1]  Outpatient Encounter Medications as of 01/01/2025  Medication Sig   acetaminophen  (TYLENOL ) 650 MG CR tablet Take 1,300 mg by mouth every 8 (eight) hours as needed for  pain.   alendronate  (FOSAMAX ) 70 MG tablet Take 1 tablet (70 mg total) by mouth every 7 (seven) days. Take with a full glass of water on an empty stomach.   ascorbic acid (VITAMIN C) 500 MG tablet Take 500 mg by mouth 2 (two) times daily.   atorvastatin  (LIPITOR) 40 MG tablet Take 1 tablet (40 mg total) by mouth daily.   butalbital -acetaminophen -caffeine  (FIORICET ) 50-325-40 MG tablet Take 2 tablets by mouth as needed for headache.   donepezil  (ARICEPT ) 10 MG tablet Take 1 tablet (10 mg total) by mouth at bedtime.   fenofibrate  (TRICOR ) 145 MG tablet Take 1 tablet (145 mg total) by mouth daily.   ketoconazole  (NIZORAL ) 2 % shampoo Three times per week, massage into scalp and leave in for 8-10 minutes before rinsing out.   lamoTRIgine   (LAMICTAL ) 200 MG tablet Take 1 tablet (200 mg total) by mouth daily.   levothyroxine  (SYNTHROID ) 88 MCG tablet Take 1 tablet (88 mcg total) by mouth daily.   lisinopril  (ZESTRIL ) 40 MG tablet Take 1 tablet (40 mg total) by mouth daily.   loratadine (CLARITIN) 10 MG tablet Take 10 mg by mouth daily.   Omega-3 Fatty Acids (FISH OIL) 1000 MG CAPS Take 1 capsule by mouth daily.   oxybutynin  (DITROPAN -XL) 10 MG 24 hr tablet Take 1 tablet (10 mg total) by mouth in the morning and at bedtime.   pregabalin  (LYRICA ) 75 MG capsule Take 1 capsule by mouth twice daily.   traZODone  (DESYREL ) 50 MG tablet Take 1 tablet (50 mg total) by mouth at bedtime.   triamcinolone  cream (KENALOG ) 0.1 % Apply topically 2 (two) times daily as needed.   venlafaxine  XR (EFFEXOR -XR) 150 MG 24 hr capsule Take 1 capsule (150 mg total) by mouth daily with breakfast.   albuterol  (VENTOLIN  HFA) 108 (90 Base) MCG/ACT inhaler Inhale 2 puffs into the lungs every 6 (six) hours as needed for wheezing or shortness of breath. (Patient not taking: Reported on 01/01/2025)   ferrous sulfate  325 (65 FE) MG tablet Take 1 tablet (325 mg total) by mouth daily. (Patient not taking: Reported on 01/01/2025)   Rimegepant Sulfate (NURTEC) 75 MG TBDP Place 1 tablet under the tongue every other day. (Patient not taking: Reported on 01/01/2025)   trimethoprim  (TRIMPEX ) 100 MG tablet Take 1 tablet (100 mg total) by mouth daily. (Patient not taking: Reported on 01/01/2025)   [DISCONTINUED] donepezil  (ARICEPT ) 10 MG tablet Take 1 tablet (10 mg total) by mouth at bedtime.   No facility-administered encounter medications on file as of 01/01/2025.    Review of Systems:  Review of Systems  Constitutional:  Negative for appetite change, chills, fatigue and fever.  HENT:  Negative for congestion, hearing loss, rhinorrhea and sore throat.   Eyes: Negative.   Respiratory:  Negative for cough, shortness of breath and wheezing.   Cardiovascular:  Negative for chest  pain, palpitations and leg swelling.  Gastrointestinal:  Negative for abdominal pain, constipation, diarrhea, nausea and vomiting.  Genitourinary:  Negative for dysuria.  Musculoskeletal:  Negative for arthralgias, back pain and myalgias.  Skin:  Negative for color change, rash and wound.  Neurological:  Negative for dizziness, weakness and headaches.  Psychiatric/Behavioral:  Negative for behavioral problems. The patient is not nervous/anxious.     Health Maintenance  Topic Date Due   COVID-19 Vaccine (6 - 2025-26 season) 01/17/2025 (Originally 08/10/2024)   Medicare Annual Wellness (AWV)  03/06/2025   Lung Cancer Screening  03/27/2025   Mammogram  03/27/2026  DTaP/Tdap/Td (4 - Td or Tdap) 03/14/2032   Pneumococcal Vaccine: 50+ Years  Completed   Influenza Vaccine  Completed   Bone Density Scan  Completed   Hepatitis C Screening  Completed   Zoster Vaccines- Shingrix  Completed   Meningococcal B Vaccine  Aged Out   Hepatitis B Vaccines 19-59 Average Risk  Discontinued   Colonoscopy  Discontinued    Physical Exam: Vitals:   01/01/25 1438 01/01/25 1513  BP: (!) 156/82 (!) 140/70  Temp: (!) 97.5 F (36.4 C)   TempSrc: Temporal   Weight: 167 lb 3.2 oz (75.8 kg)   Height: 5' 4 (1.626 m)    Body mass index is 28.7 kg/m. Physical Exam Constitutional:      Appearance: Normal appearance.  HENT:     Head: Normocephalic and atraumatic.     Nose: Nose normal.     Mouth/Throat:     Mouth: Mucous membranes are moist.  Eyes:     Conjunctiva/sclera: Conjunctivae normal.  Cardiovascular:     Rate and Rhythm: Normal rate and regular rhythm.  Pulmonary:     Effort: Pulmonary effort is normal.     Breath sounds: Normal breath sounds.  Abdominal:     General: Bowel sounds are normal.     Palpations: Abdomen is soft.  Musculoskeletal:        General: Normal range of motion.     Cervical back: Normal range of motion.  Skin:    General: Skin is warm and dry.  Neurological:      General: No focal deficit present.     Mental Status: She is alert and oriented to person, place, and time.  Psychiatric:        Mood and Affect: Mood normal.        Behavior: Behavior normal.        Thought Content: Thought content normal.        Judgment: Judgment normal.     Labs reviewed: Basic Metabolic Panel: Recent Labs    01/13/24 1138 03/10/24 1128 06/18/24 1334 10/01/24 1422 01/01/25 1732  NA 139  --   --  139 139  K 4.4  --   --  4.1 3.9  CL 103  --   --  102 103  CO2 27  --   --  29 26  GLUCOSE 86  --   --  89 79  BUN 16  --   --  19 24  CREATININE 1.06*  --   --  0.99 1.23*  CALCIUM  9.1  --   --  9.3 9.5  TSH 5.15* 4.56* 0.49 3.33  --    Liver Function Tests: Recent Labs    10/01/24 1422  AST 13  ALT 11  BILITOT 0.5  PROT 7.2   No results for input(s): LIPASE, AMYLASE in the last 8760 hours. No results for input(s): AMMONIA in the last 8760 hours. CBC: Recent Labs    01/13/24 1138 10/01/24 1422  WBC 6.6 7.0  NEUTROABS 3,828 4,305  HGB 12.8 12.5  HCT 38.6 37.8  MCV 93.9 95.0  PLT 202 186   Lipid Panel: Recent Labs    01/13/24 1138 10/01/24 1422 01/01/25 1732  CHOL 153 192 127  HDL 42* 38* 48*  LDLCALC 74 114* 54  TRIG 279* 302* 172*  CHOLHDL 3.6 5.1* 2.6   Lab Results  Component Value Date   HGBA1C 5.6 01/13/2024    Procedures since last visit: No results found.  Assessment/Plan  1. Postoperative hypothyroidism (Primary) -  Well-managed with levothyroxine . Recent TSH normal at 3.33. - Continue levothyroxine  88 mcg daily.  2. Primary hypertension -  Blood pressure elevated at 156/82, likely due to fasting and missed medication. Home readings normal. - Continue lisinopril  40 mg daily. - Basic Metabolic Panel  3. OAB (overactive bladder) -  Good response to oxybutynin  and PTNS. - Continue oxybutynin  10 mg 24-hour. - Continue PTNS therapy.  4. Age-related osteoporosis without current pathological fracture -   Managed with Fosamax . - Continue Fosamax  once weekly.  5. Mixed hyperlipidemia -  Elevated triglycerides and LDL. On atorvastatin  with dietary modifications and exercise. - Continue atorvastatin  40 mg daily. - Encouraged continued dietary modifications and exercise. - Lipid Panel  6. Bipolar affective disorder in remission -  Well-managed on current medications. - Continue venlafaxine  150 mg daily. - Continue trazodone  50 mg at bedtime. - Continue Lyrica  75 mg twice daily. - Continue Lamictal  200 mg daily.  7. Major neurocognitive disorder (HCC) -  Good memory function with donepezil . - Continue donepezil  10 mg daily.  8. Smoker -  Long-standing dependence, smoking 10-14 cigarettes daily. No desire to quit. - Encouraged reduction in smoking.      Labs/tests ordered:  BMP and lipid panel   Return in about 3 months (around 04/01/2025).  Yader Criger Medina-Vargas, NP     [1]  Allergies Allergen Reactions   Latex Rash   Other Rash    Tape made her have blisters   Wound Dressing Adhesive Rash   "

## 2025-01-02 LAB — BASIC METABOLIC PANEL WITH GFR
BUN/Creatinine Ratio: 20 (calc) (ref 6–22)
BUN: 24 mg/dL (ref 7–25)
CO2: 26 mmol/L (ref 20–32)
Calcium: 9.5 mg/dL (ref 8.6–10.4)
Chloride: 103 mmol/L (ref 98–110)
Creat: 1.23 mg/dL — ABNORMAL HIGH (ref 0.60–1.00)
Glucose, Bld: 79 mg/dL (ref 65–99)
Potassium: 3.9 mmol/L (ref 3.5–5.3)
Sodium: 139 mmol/L (ref 135–146)
eGFR: 47 mL/min/{1.73_m2} — ABNORMAL LOW

## 2025-01-02 LAB — LIPID PANEL
Cholesterol: 127 mg/dL
HDL: 48 mg/dL — ABNORMAL LOW
LDL Cholesterol (Calc): 54 mg/dL
Non-HDL Cholesterol (Calc): 79 mg/dL
Total CHOL/HDL Ratio: 2.6 (calc)
Triglycerides: 172 mg/dL — ABNORMAL HIGH

## 2025-01-03 ENCOUNTER — Ambulatory Visit: Payer: Self-pay | Admitting: Adult Health

## 2025-01-03 NOTE — Progress Notes (Signed)
-    electrolytes normal and kidney function same as 3 months ago, stable 0  Triglycerides 172, down from 302 (3 months ago) -  LDL 54, down from 114 -  continue current medications, exercise and low fat diet

## 2025-01-05 ENCOUNTER — Ambulatory Visit (HOSPITAL_COMMUNITY): Admitting: Psychiatry

## 2025-02-16 ENCOUNTER — Ambulatory Visit (HOSPITAL_COMMUNITY): Admitting: Psychiatry

## 2025-04-05 ENCOUNTER — Institutional Professional Consult (permissible substitution): Admitting: Neurology

## 2025-04-06 ENCOUNTER — Ambulatory Visit: Admitting: Adult Health

## 2025-08-19 ENCOUNTER — Ambulatory Visit: Admitting: Dermatology

## 2025-12-22 ENCOUNTER — Ambulatory Visit: Admitting: Physician Assistant
